# Patient Record
Sex: Female | Born: 1950
Health system: Southern US, Community
[De-identification: ages and names within clinical notes are randomized; demographics above are authoritative.]

## PROBLEM LIST (undated history)

## (undated) DIAGNOSIS — R51 Headache: Secondary | ICD-10-CM

## (undated) DIAGNOSIS — I10 Essential (primary) hypertension: Secondary | ICD-10-CM

## (undated) DIAGNOSIS — K219 Gastro-esophageal reflux disease without esophagitis: Secondary | ICD-10-CM

## (undated) DIAGNOSIS — E785 Hyperlipidemia, unspecified: Secondary | ICD-10-CM

## (undated) DIAGNOSIS — L409 Psoriasis, unspecified: Secondary | ICD-10-CM

## (undated) DIAGNOSIS — J449 Chronic obstructive pulmonary disease, unspecified: Secondary | ICD-10-CM

## (undated) DIAGNOSIS — J45909 Unspecified asthma, uncomplicated: Secondary | ICD-10-CM

## (undated) DIAGNOSIS — J310 Chronic rhinitis: Secondary | ICD-10-CM

## (undated) HISTORY — DX: Essential (primary) hypertension: I10

## (undated) HISTORY — DX: Headache: R51

## (undated) HISTORY — PX: COLONOSCOPY: SHX174

## (undated) HISTORY — DX: Psoriasis, unspecified: L40.9

## (undated) HISTORY — DX: Unspecified asthma, uncomplicated: J45.909

## (undated) HISTORY — DX: Chronic rhinitis: J31.0

---

## 1999-03-25 HISTORY — PX: NASAL SINUS SURGERY: SHX719

## 1999-07-17 ENCOUNTER — Encounter (INDEPENDENT_AMBULATORY_CARE_PROVIDER_SITE_OTHER): Payer: Self-pay | Admitting: Specialist

## 1999-07-17 ENCOUNTER — Other Ambulatory Visit: Admission: RE | Admit: 1999-07-17 | Discharge: 1999-07-17 | Payer: Self-pay | Admitting: *Deleted

## 1999-11-28 ENCOUNTER — Other Ambulatory Visit: Admission: RE | Admit: 1999-11-28 | Discharge: 1999-11-28 | Payer: Self-pay | Admitting: *Deleted

## 2006-07-13 ENCOUNTER — Ambulatory Visit (HOSPITAL_COMMUNITY): Admission: RE | Admit: 2006-07-13 | Discharge: 2006-07-13 | Payer: Self-pay | Admitting: Chiropractic Medicine

## 2006-08-20 ENCOUNTER — Encounter: Admission: RE | Admit: 2006-08-20 | Discharge: 2006-08-20 | Payer: Self-pay | Admitting: Obstetrics and Gynecology

## 2006-08-25 ENCOUNTER — Encounter: Admission: RE | Admit: 2006-08-25 | Discharge: 2006-08-25 | Payer: Self-pay | Admitting: Obstetrics and Gynecology

## 2007-08-23 ENCOUNTER — Encounter: Admission: RE | Admit: 2007-08-23 | Discharge: 2007-08-23 | Payer: Self-pay | Admitting: Counselor

## 2007-10-01 ENCOUNTER — Encounter: Admission: RE | Admit: 2007-10-01 | Discharge: 2007-10-01 | Payer: Self-pay | Admitting: Obstetrics and Gynecology

## 2007-10-11 ENCOUNTER — Encounter: Admission: RE | Admit: 2007-10-11 | Discharge: 2007-10-11 | Payer: Self-pay | Admitting: Obstetrics and Gynecology

## 2008-09-08 ENCOUNTER — Encounter: Payer: Self-pay | Admitting: Pulmonary Disease

## 2008-09-29 ENCOUNTER — Ambulatory Visit: Payer: Self-pay | Admitting: Pulmonary Disease

## 2008-09-29 DIAGNOSIS — R0602 Shortness of breath: Secondary | ICD-10-CM | POA: Insufficient documentation

## 2008-09-29 DIAGNOSIS — J4520 Mild intermittent asthma, uncomplicated: Secondary | ICD-10-CM | POA: Insufficient documentation

## 2008-09-29 DIAGNOSIS — J310 Chronic rhinitis: Secondary | ICD-10-CM | POA: Insufficient documentation

## 2008-09-29 DIAGNOSIS — J4540 Moderate persistent asthma, uncomplicated: Secondary | ICD-10-CM | POA: Insufficient documentation

## 2008-11-16 ENCOUNTER — Ambulatory Visit: Payer: Self-pay | Admitting: Pulmonary Disease

## 2008-11-16 LAB — CONVERTED CEMR LAB
ALT: 13 units/L (ref 0–35)
AST: 19 units/L (ref 0–37)
Albumin: 4.1 g/dL (ref 3.5–5.2)
Alkaline Phosphatase: 76 units/L (ref 39–117)
BUN: 15 mg/dL (ref 6–23)
Basophils Absolute: 0.1 10*3/uL (ref 0.0–0.1)
Basophils Relative: 0.7 % (ref 0.0–3.0)
Bilirubin, Direct: 0.1 mg/dL (ref 0.0–0.3)
CO2: 29 meq/L (ref 19–32)
Calcium: 9.2 mg/dL (ref 8.4–10.5)
Chloride: 107 meq/L (ref 96–112)
Creatinine, Ser: 0.8 mg/dL (ref 0.4–1.2)
Eosinophils Absolute: 0.1 10*3/uL (ref 0.0–0.7)
Eosinophils Relative: 1 % (ref 0.0–5.0)
GFR calc non Af Amer: 78.73 mL/min (ref >60)
Glucose, Bld: 90 mg/dL (ref 70–99)
HCT: 42.7 % (ref 36.0–46.0)
Hemoglobin: 14.3 g/dL (ref 12.0–15.0)
Lymphocytes Relative: 32.9 % (ref 12.0–46.0)
Lymphs Abs: 2.4 10*3/uL (ref 0.7–4.0)
MCHC: 33.5 g/dL (ref 30.0–36.0)
MCV: 90.7 fL (ref 78.0–100.0)
Monocytes Absolute: 0.6 10*3/uL (ref 0.1–1.0)
Monocytes Relative: 8.8 % (ref 3.0–12.0)
Neutro Abs: 4.1 10*3/uL (ref 1.4–7.7)
Neutrophils Relative %: 56.6 % (ref 43.0–77.0)
Platelets: 254 10*3/uL (ref 150.0–400.0)
Potassium: 4.1 meq/L (ref 3.5–5.1)
RBC: 4.71 M/uL (ref 3.87–5.11)
RDW: 13 % (ref 11.5–14.6)
Sodium: 142 meq/L (ref 135–145)
Total Bilirubin: 0.8 mg/dL (ref 0.3–1.2)
Total Protein: 7.2 g/dL (ref 6.0–8.3)
WBC: 7.3 10*3/uL (ref 4.5–10.5)

## 2008-11-22 LAB — CONVERTED CEMR LAB: IgE (Immunoglobulin E), Serum: 45 intl units/mL (ref 0.0–180.0)

## 2008-12-15 ENCOUNTER — Ambulatory Visit: Payer: Self-pay | Admitting: Pulmonary Disease

## 2008-12-15 DIAGNOSIS — R490 Dysphonia: Secondary | ICD-10-CM | POA: Insufficient documentation

## 2009-01-05 ENCOUNTER — Encounter: Admission: RE | Admit: 2009-01-05 | Discharge: 2009-01-05 | Payer: Self-pay | Admitting: Obstetrics and Gynecology

## 2010-01-24 ENCOUNTER — Encounter: Admission: RE | Admit: 2010-01-24 | Discharge: 2010-01-24 | Payer: Self-pay | Admitting: Family Medicine

## 2010-04-23 NOTE — Assessment & Plan Note (Signed)
Summary: f/u 4 wks ///kp   Primary Provider/Referring Provider:  [facility], [facility]  CC:  4 week rhinitis and dyspnea follow-up.   Pt still c/o a productive cough and nasal draingae that is clear.   She denies any sob and wheezing and fever.Marland Kitchen  History of Present Illness: I met Ms. Yogi today to follow up her dyspnea, asthma and chronic rhinitis.  She continues to have sinus drainage.  This has improved some since her last visit.  She has a cough mostly in the morning, and is productive of clear sputum.  She gets a raspy voice.  She denies hemoptysis, epistaxis, and fever.  She has some chest congestion, but feels her breathing is okay.  She denies wheeze, sneezing, rashes, and eye irritation.    She has been using OTC anti-histamines without much improvement.  She thinks she had imaging of sinus within the year, and was told she had chronic sinus infection.  Chest xray from September 08, 2008 showed chronic bronchitic changes.  Current Medications (verified): 1)  Symbicort 160-4.5 Mcg/act  Aero (Budesonide-Formoterol Fumarate) .... Two Puffs Twice Daily 2)  Singulair 10 Mg Tabs (Montelukast Sodium) .... One By Mouth At Bedtime 3)  Proair Hfa 108 (90 Base) Mcg/act  Aers (Albuterol Sulfate) .Marland Kitchen.. 1-2 Puffs Every 4-6 Hours As Needed 4)  Nasonex 50 Mcg/act Susp (Mometasone Furoate) .... Two Sprays Once Daily 5)  Loratadine 10 Mg Tabs (Loratadine) .... One By Mouth Once Daily As Needed 6)  Ibuprofen 200 Mg Tabs (Ibuprofen)  Allergies (verified): 1)  ! Penicillin  Past History:  Past Medical History: Last updated: 09/29/2008 Asthma Rhinitis Headache Psoriasis  Past Surgical History: Last updated: 09/29/2008 Sinus surgery in 2001  Family History: Last updated: 09/29/2008 Allergies---2 brothers and 1 sister Family History Hypertension---nother and father Family History MI/Heart Attack---mother and father CHF---father Breast cancer---sister Colon cancer---mother   Social History: Last updated: 09/29/2008 Former smoker, 2 packs per day for 20 years.  Quit 1995. Widowed, lives alone.  Family History: Reviewed history from 09/29/2008 and no changes required. Allergies---2 brothers and 1 sister Family History Hypertension---nother and father Family History MI/Heart Attack---mother and father CHF---father Breast cancer---sister Colon cancer---mother  Social History: Reviewed history from 09/29/2008 and no changes required. Former smoker, 2 packs per day for 20 years.  Quit 1995. Widowed, lives alone.  Vital Signs:  Patient profile:   61 year old female Height:      64 inches (162.56 cm) Weight:      217.13 pounds (98.70 kg) BMI:     37.40 O2 Sat:      94 % on Room air Temp:     97.8 degrees F (36.56 degrees C) oral Pulse rate:   72 / minute BP sitting:   124 / 80  (left arm) Cuff size:   large  Vitals Entered By: Michel Bickers CMA (November 16, 2008 12:15 PM)  O2 Flow:  Room air  Physical Exam  General:  normal appearance, healthy appearing, and obese.   Nose:  clear drainage, no tenderness Mouth:  MP 2, 2+ tonsills, no exudate Neck:  no masses, thyromegaly Lungs:  clear bilaterally to auscultation and percussion Heart:  regular rate and rhythm, S1, S2 without murmurs, rubs, gallops, or clicks Abdomen:  bowel sounds positive; abdomen soft and non-tender without masses, or organomegaly Extremities:  no clubbing, cyanosis, edema, or deformity noted Cervical Nodes:  no significant adenopathy   Impression & Recommendations:  Problem # 1:  ASTHMA (ICD-493.90)  Her asthma symptoms have improved with her current regimen.  Problem # 2:  CHRONIC RHINITIS (ICD-472.0) I think most of her current symptoms are related to her sinus disease.  I have asked to track down her sinus imaging studies, and forward a copy for my review.  I will continue her on nasal irrigation, nasonex, singulair, and as needed diphenydramine.  I will also check a  RAST and IgE.  Medications Added to Medication List This Visit: 1)  Diphenhydramine Hcl 50 Mg/ml Soln (Diphenhydramine hcl) .... One by mouth at bedtime as needed  Complete Medication List: 1)  Symbicort 160-4.5 Mcg/act Aero (Budesonide-formoterol fumarate) .... Two puffs twice daily 2)  Singulair 10 Mg Tabs (Montelukast sodium) .... One by mouth at bedtime 3)  Proair Hfa 108 (90 Base) Mcg/act Aers (Albuterol sulfate) .Marland Kitchen.. 1-2 puffs every 4-6 hours as needed 4)  Nasonex 50 Mcg/act Susp (Mometasone furoate) .... Two sprays once daily 5)  Loratadine 10 Mg Tabs (Loratadine) .... One by mouth once daily as needed 6)  Ibuprofen 200 Mg Tabs (Ibuprofen) 7)  Diphenhydramine Hcl 50 Mg/ml Soln (Diphenhydramine hcl) .... One by mouth at bedtime as needed  Other Orders: Est. Patient Level III (32951) T-"RAST" (Allergy Full Profile) IGE (88416-60630) TLB-BMP (Basic Metabolic Panel-BMET) (80048-METABOL) TLB-CBC Platelet - w/Differential (85025-CBCD) TLB-Hepatic/Liver Function Pnl (80076-HEPATIC)  Patient Instructions: 1)  Blood test today 2)  Try using diphenhydramine (benadryl) 50 mg at bedtime for one week, then as needed after that 3)  Follow up in 4 to 6 weeks

## 2010-04-23 NOTE — Assessment & Plan Note (Signed)
Summary: copd/asthma/apc   Primary Provider/Referring Provider:  [facility]  CC:  Pulmonary consult for sob.   Pt referred by [facility].   She did bring a CD of the CXR.Marland Kitchen  History of Present Illness: I met Ms. Metter today to evaluate her dyspnea.  She developed a cough and fever in June.  She had an acute asthma attack, and was treated with prednisone and a breathing treatment.  She did not use antibiotics.  She was told about 10 years ago that she has asthma.  This was associated with chronic sinus congestion.  She had sinus surgery 10 years ago, and this improved her symptoms some.    She used to see Dr. Sidney Ace, and was told she had allergies to dust mite and mold.  She currently has a cough with white sputum.  She denies hemoptysis.  She does get some wheeze, and tightness in her chest.  She thinks some of her wheeze comes from her chest and some from her throat.  She has been getting hoarse as a result of her cough.  She has been getting more sinus congestion with post-nasal drip.  She has also been getting a globus sensation.  She has occasional reflux and uses as needed prilosec.  She gets hives when she takes penicillin.  She does not have any reaction to using aspirin.  She quit smoking 15 years ago, and smoked 2 to 3 packs per day for 20 years.  She moved to [place] from [place] 5 years ago.  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  She was using astepro and singulair, but stop these because she wasn't sure if they were helping.  She was started on symbicort, and this helped.  As a result she has not needed to use her proair for the past two weeks.  She has been using her netty pott, but only as needed rather than on a regular basis.  She has gained about 70 lbs over the past few years.  Chest xray from September 08, 2008 showed chronic bronchitic changes.   Spirometry today was normal.  Preventive Screening-Counseling & Management  Alcohol-Tobacco     Smoking Status: quit     Year Quit: 1995     Pack years: 20 yrs x2 PPD  Allergies: 1)  ! Penicillin  Past History:  Past Medical History: Asthma Rhinitis Headache Psoriasis  Past Surgical History: Sinus surgery in 2001  Family History: Allergies---2 brothers and 1 sister Family History Hypertension---nother and father Family History MI/Heart Attack---mother and father CHF---father Breast cancer---sister Colon cancer---mother  Social History: Former smoker, 2 packs per day for 20 years.  Quit 1995. Widowed, lives alone. Smoking Status:  quit Pack years:  20 yrs x2 PPD  Vital Signs:  Patient profile:   60 year old female Height:      64 inches (162.56 cm) Weight:      227 pounds (103.18 kg) BMI:     39.11 O2 Sat:      96 % on Room air Temp:     97.9 degrees F (36.61 degrees C) oral Pulse rate:   70 / minute BP sitting:   124 / 80  (left arm) Cuff size:   large  Vitals Entered By: Michel Bickers CMA (September 29, 2008 3:16 PM)  O2 Flow:  Room air CC: Pulmonary consult  for sob.   Pt referred by [facility].   She did bring a CD of the CXR.   Physical Exam  General:  normal appearance, healthy appearing, and obese.   Eyes:  PERRLA and EOMI.   Ears:  mild cerumen build up Nose:  clear drainage, no tenderness Mouth:  MP 2, 2+ tonsills, no exudate Neck:  no masses, thyromegaly Chest Wall:  no deformities noted Lungs:  coarse breath sounds, no wheezing or rales Heart:  regular rate and rhythm, S1, S2 without murmurs, rubs, gallops, or clicks Abdomen:  bowel sounds positive; abdomen soft and non-tender without masses, or organomegaly Pulses:  pulses normal Extremities:  no clubbing, cyanosis, edema, or deformity noted Neurologic:  CN II-XII grossly intact with normal reflexes, coordination, muscle strength and tone Cervical Nodes:  no significant adenopathy    Pre-Spirometry FVC    Value: 3.52 L/min   Pred: 3.17 L/min     % Pred: 111.00 % FEV1    Value: 2.55 L     Pred: 2.55 L     % Pred: 100 % FEV1/FVC  Value: 0.72 %     Pred: 0.80 %    FEF 25-75  Value: 1.68 L/min   Pred: 2.60 L/min     Impression & Recommendations:  Problem # 1:  DYSPNEA (ICD-786.05) She has a history of asthma and rhinitis.  She has a previous history of tobacco abuse, but her spirometry today did not show airflow obstruction.  I think her sinus disease is perpetuating her asthma symptoms which is the most likely cause of her dyspnea.  She may also have a component of deconditioning related to her weight gain and inactivity.  Problem # 2:  ASTHMA (ICD-493.90) She is to continue symbicort and restart singulair.  Hopefully once her sinus problems have improved her asthma, and dyspnea will improve as well.  Problem # 3:  CHRONIC RHINITIS (ICD-472.0) She is to continue her sinus regimen with nasal irrigation.  She is nasonex once daily, and restart singulair at bedtime.  She is to also use loratadine as needed.  Medications Added to Medication List This Visit: 1)  Singulair 10 Mg Tabs (Montelukast sodium) .... One by mouth at bedtime 2)  Nasonex 50 Mcg/act Susp (Mometasone furoate) .... Two sprays once daily 3)  Loratadine 10 Mg Tabs (Loratadine) .... One by mouth once daily as needed  Complete Medication List: 1)  Symbicort 160-4.5 Mcg/act Aero (Budesonide-formoterol fumarate) .... Two puffs twice daily 2)  Singulair 10 Mg Tabs (Montelukast sodium) .... One by mouth at bedtime 3)  Proair Hfa 108 (90 Base) Mcg/act Aers (Albuterol sulfate) .Marland Kitchen.. 1-2 puffs every 4-6 hours as needed 4)  Nasonex 50 Mcg/act Susp (Mometasone furoate) .... Two sprays once daily 5)  Loratadine 10 Mg Tabs (Loratadine) .... One by mouth once daily as needed 6)  Ibuprofen 200 Mg Tabs (Ibuprofen)  Other Orders: Consultation Level IV (16109) Spirometry w/Graph (94010)  Patient Instructions: 1)   Continue symbicort and proair 2)  Restart singulair 10 mg at bedtime 3)  Netty Pott once daily before nasonex 4)  Nasonex two sprays once daily  5)  Loratadine 10 mg once daily for one week, then as needed  6)  Follow up in 4 weeks Prescriptions: SINGULAIR 10 MG TABS (MONTELUKAST SODIUM) one by mouth at bedtime  #30 x 3   Entered and Authorized by:   Coralyn Helling MD   Signed by:   Coralyn Helling MD on 09/29/2008   Method  used:   Print then Give to Patient   RxID:   [id number] NASONEX 50 MCG/ACT SUSP (MOMETASONE FUROATE) two sprays once daily  #1 x 3   Entered and Authorized by:   Coralyn Helling MD   Signed by:   Coralyn Helling MD on 09/29/2008   Method used:   Print then Give to Patient   RxID:   [id number]   SPIROMETRY RESULTS  Date:09/29/2008 height inches: 64 Gender: Female  Parameter  Measured Predicted %Predicted  FVC      3.52        3.17      111.00  FEV1      2.55        2.55      100  FEV1/FVC      0.72        0.80      90  FEF25-75      1.68        2.60      65  PEF              395      0  Post-Bronchodilator  Parameter Measured Change from Baseline  FVC               FEV1               FEV1/FVC                FEF25-75                PEF               Pulse Oximetry Pulse: 70 O2 Saturation %: 96  Comments: Normal spirometry.

## 2010-12-17 ENCOUNTER — Other Ambulatory Visit: Payer: Self-pay | Admitting: Obstetrics and Gynecology

## 2010-12-17 DIAGNOSIS — Z1231 Encounter for screening mammogram for malignant neoplasm of breast: Secondary | ICD-10-CM

## 2010-12-19 ENCOUNTER — Ambulatory Visit
Admission: RE | Admit: 2010-12-19 | Discharge: 2010-12-19 | Disposition: A | Discharge status: Home / Self Care | Payer: 59 | Source: Ambulatory Visit | Attending: Obstetrics and Gynecology | Admitting: Obstetrics and Gynecology

## 2010-12-19 DIAGNOSIS — Z1231 Encounter for screening mammogram for malignant neoplasm of breast: Secondary | ICD-10-CM

## 2011-12-11 ENCOUNTER — Ambulatory Visit: Payer: 59 | Admitting: Pulmonary Disease

## 2011-12-24 ENCOUNTER — Institutional Professional Consult (permissible substitution): Payer: 59 | Admitting: Pulmonary Disease

## 2012-01-23 ENCOUNTER — Ambulatory Visit (INDEPENDENT_AMBULATORY_CARE_PROVIDER_SITE_OTHER): Payer: 59 | Admitting: Pulmonary Disease

## 2012-01-23 ENCOUNTER — Encounter: Payer: Self-pay | Admitting: *Deleted

## 2012-01-23 ENCOUNTER — Ambulatory Visit (INDEPENDENT_AMBULATORY_CARE_PROVIDER_SITE_OTHER)
Admission: RE | Admit: 2012-01-23 | Discharge: 2012-01-23 | Disposition: A | Discharge status: Home / Self Care | Payer: 59 | Source: Ambulatory Visit | Attending: Pulmonary Disease | Admitting: Pulmonary Disease

## 2012-01-23 VITALS — BP 128/84 | HR 83 | Temp 98.5°F | Ht 64.0 in | Wt 215.8 lb

## 2012-01-23 DIAGNOSIS — J45909 Unspecified asthma, uncomplicated: Secondary | ICD-10-CM

## 2012-01-23 DIAGNOSIS — R053 Chronic cough: Secondary | ICD-10-CM | POA: Insufficient documentation

## 2012-01-23 DIAGNOSIS — J310 Chronic rhinitis: Secondary | ICD-10-CM

## 2012-01-23 DIAGNOSIS — R059 Cough, unspecified: Secondary | ICD-10-CM

## 2012-01-23 DIAGNOSIS — R05 Cough: Secondary | ICD-10-CM

## 2012-01-23 MED ORDER — CETIRIZINE HCL 10 MG PO TABS: ORAL_TABLET | ORAL | Status: DC

## 2012-01-23 MED ORDER — DIPHENHYDRAMINE HCL 50 MG PO CAPS: ORAL_CAPSULE | ORAL | Status: DC

## 2012-01-23 MED ORDER — ALBUTEROL SULFATE HFA 108 (90 BASE) MCG/ACT IN AERS: 2.0000 | INHALATION_SPRAY | Freq: Four times a day (QID) | RESPIRATORY_TRACT | Status: DC | PRN

## 2012-01-23 MED ORDER — MOMETASONE FUROATE 50 MCG/ACT NA SUSP: 2.0000 | Freq: Every day | NASAL | Status: DC

## 2012-01-23 NOTE — Patient Instructions (Signed)
Nasal irrigation (saline nasal spray) daily until next visit Nasonex two sprays in each nostril daily after nasal irrigation until next visit Cetirizine 10 mg daily for two weeks, then as needed Diphenhydramine 50 mg nightly for two weeks, then as needed Proair two puffs as needed for cough, wheeze, or chest congestion Chest xray today Will schedule breathing test (PFT) Follow up in 4 weeks.

## 2012-01-23 NOTE — Progress Notes (Signed)
Primary Care Physician:   Referring provider:    Chief Complaint  Patient presents with  . Advice Only    last seen 11/2008. c/o constant cough w/ white phlem, blows out clear phlem. nasal congestion, PND. thinks her problems are related to certain foods    History of Present Illness: Michele Reyes is a 61 y.o. female former smoker for evaluation of chronic cough.  I last saw her in September 2010.  Concern at that time was related to asthma, and rhinitis with post-nasal drip.  She was seen by Dr. Lazarus Salines in 2011 for sinus infection.  She was treated with flonase and astepro.  She is not using these now.  She was recommend to use nasal irrigation, but is not using this either.  She felt benadryl helped her sleep and sinus congestion, but was advised by ENT not to use this.  She was on singulair, but this didn't help.  She had allergy testing previously by Dr. Amery Callas, but reports that allergy tests were negative.  She gets a cough with white sputum in the mornings.  She has sinus congestion and post-nasal drip.  She gets occasional wheeze when she lays in a bad position in bed.  She gets a globus sensation, but denies dysphagia/odynophagia.  She denies ear pain or ringing.  Strong smells can bring on her symptoms.   She tried adjusting her diet to determine if she had food intolerance causing her symptoms.  She lost weight with diet change, and felt better.  Unfortunately, she could not stick with her diet and regained weight.  She also feels that stress at work contributes to her symptoms.  She gets occasional heartburn, and uses prilosec as needed.  She gets a rash from psoriasis, but not otherwise.  She quit smoking years ago.  There is no history of pneumonia or exposure to TB.  Tests: Spirometry 09/29/08 >> FEV1 2.55(100%), FEV1% 72 11/16/08 IgE 45 CT sinus 01/24/10>>b/l maxillary sinusitis  Past Medical History  Diagnosis Date  . Asthma   . Rhinitis   . Headache   . Psoriasis    . Hypertension     Past Surgical History  Procedure Date  . Nasal sinus surgery 2001    Current Outpatient Prescriptions on File Prior to Visit  Medication Sig Dispense Refill  . albuterol (PROVENTIL HFA;VENTOLIN HFA) 108 (90 BASE) MCG/ACT inhaler Inhale 2 puffs into the lungs every 6 (six) hours as needed.      Marland Kitchen amLODipine-olmesartan (AZOR) 5-40 MG per tablet Take 1 tablet by mouth daily.        Allergies  Allergen Reactions  . Levaquin (Levofloxacin) Hives  . Penicillins     REACTION: hives    Family History  Problem Relation Age of Onset  . Allergies Brother     x2  . Allergies Sister   . Hypertension Mother   . Hypertension Father   . Heart attack Mother   . Heart attack Father   . Heart failure Father   . Breast cancer Sister   . Colon cancer Mother     History  Substance Use Topics  . Smoking status: Former Smoker -- 2.0 packs/day for 10 years    Types: Cigarettes    Quit date: 03/24/1993  . Smokeless tobacco: Not on file  . Alcohol Use: Yes     occasionally    Review of Systems  Constitutional: Positive for unexpected weight change. Negative for fever and appetite change.  HENT: Positive for congestion  and postnasal drip. Negative for ear pain, sore throat, sneezing, trouble swallowing and dental problem.   Respiratory: Positive for cough and shortness of breath.   Cardiovascular: Negative for chest pain, palpitations and leg swelling.  Gastrointestinal: Negative for abdominal pain.  Musculoskeletal: Negative for joint swelling.  Skin: Negative for rash.  Neurological: Negative for headaches.  Psychiatric/Behavioral: Positive for hallucinations. The patient is nervous/anxious.     Physical Exam: Filed Vitals:   01/23/12 0925  BP: 128/84  Pulse: 83  Temp: 98.5 F (36.9 C)  TempSrc: Oral  Height: 5\' 4"  (1.626 m)  Weight: 215 lb 12.8 oz (97.886 kg)  SpO2: 96%    Current Encounter SPO2  01/23/12 0925 96%  12/15/08 1419 95%  11/16/08 1200  94%    Wt Readings from Last 3 Encounters:  01/23/12 215 lb 12.8 oz (97.886 kg)  12/15/08 215 lb (97.523 kg)  11/16/08 217 lb 2.1 oz (98.49 kg)    Body mass index is 37.04 kg/(m^2).   General - No distress ENT - No sinus tenderness, no oral exudate, no LAN, no thyromegaly, TM clear, pupils equal/reactive Cardiac - s1s2 regular, no murmur, pulses symmetric Chest - No wheeze/rales/dullness, good air entry, normal respiratory excursion Back - No focal tenderness Abd - Soft, non-tender, no organomegaly, + bowel sounds Ext - No edema Neuro - Normal strength, cranial nerves intact Skin - No rashes Psych - Normal mood, and behavior.   Lab Results  Component Value Date   WBC 7.3 11/16/2008   HGB 14.3 11/16/2008   HCT 42.7 11/16/2008   MCV 90.7 11/16/2008   PLT 254.0 11/16/2008    Lab Results  Component Value Date   CREATININE 0.8 11/16/2008   BUN 15 11/16/2008   NA 142 11/16/2008   K 4.1 11/16/2008   CL 107 11/16/2008   CO2 29 11/16/2008    Lab Results  Component Value Date   ALT 13 11/16/2008   AST 19 11/16/2008   ALKPHOS 76 11/16/2008   BILITOT 0.8 11/16/2008    Assessment/Plan:  Coralyn Helling, MD [facility] Pager:  [phone] 01/23/2012, 9:28 AM

## 2012-01-23 NOTE — Assessment & Plan Note (Signed)
I think the majority of her symptoms are related to rhinitis and post-nasal drip.  Will have her use nasal irrigation and nasonex on a regular basis until next visit.  She can use cetirizine and diphenhydramine for two weeks, and then as needed.  Depending on her response will determine whether she needs additional sinus imaging studies or allergy testing.  Of note is that she was tried on flonase previously for several months from ENT, but this was not effective for her.

## 2012-01-23 NOTE — Assessment & Plan Note (Signed)
She can continue proair as needed.  Will further assess with pulmonary function testing.

## 2012-01-23 NOTE — Assessment & Plan Note (Signed)
Likely related to rhinitis, post-nasal drip, and asthma.  She has reflux also, but not a prominent symptom at present.

## 2012-01-23 NOTE — Progress Notes (Deleted)
  Subjective:    Patient ID: Michele Reyes, female    DOB: 1951/03/14, 61 y.o.   MRN: [id number]  HPI    Review of Systems  Constitutional: Positive for unexpected weight change. Negative for fever and appetite change.  HENT: Positive for congestion and postnasal drip. Negative for ear pain, sore throat, sneezing, trouble swallowing and dental problem.   Respiratory: Positive for cough and shortness of breath.   Cardiovascular: Negative for chest pain, palpitations and leg swelling.  Gastrointestinal: Negative for abdominal pain.  Musculoskeletal: Negative for joint swelling.  Skin: Negative for rash.  Neurological: Negative for headaches.  Psychiatric/Behavioral: Positive for hallucinations. The patient is nervous/anxious.        Objective:   Physical Exam        Assessment & Plan:

## 2012-01-27 ENCOUNTER — Telehealth: Payer: Self-pay | Admitting: Pulmonary Disease

## 2012-01-27 NOTE — Telephone Encounter (Signed)
Dg Chest 2 View  01/23/2012  *RADIOLOGY REPORT*  Clinical Data: Chronic coughing.  History of smoking in the past.   CHEST - 2 VIEW  Comparison: None.   Findings: The cardiac silhouette is normal size and shape. Ectasia and nonaneurysmal calcification of the thoracic aorta are seen. There is flattening of the diaphragm on lateral image with generalized hyperinflation configuration consistent with COPD.  No pulmonary infiltrates or masses were evident.  There is some central peribronchial thickening. No pleural abnormality is evident.  Changes of degenerative disc disease and degenerative spondylosis are present.   IMPRESSION: Generalized hyperinflation configuration with flattening of diaphragm consistent with element of COPD.  No peripheral infiltrate or consolidation is evident.  Minimal central peribronchial thickening is present.  This may be associated with bronchitis, asthma, and reactive airway disease.    Original Report Authenticated By: Onalee Hua Call    Results d/w pt.  Advised her to continue prn albuterol, continue sinus regimen, and will call back once PFT's reviewed.

## 2012-02-25 ENCOUNTER — Encounter: Payer: Self-pay | Admitting: Pulmonary Disease

## 2012-02-25 ENCOUNTER — Ambulatory Visit (INDEPENDENT_AMBULATORY_CARE_PROVIDER_SITE_OTHER): Payer: 59 | Admitting: Pulmonary Disease

## 2012-02-25 VITALS — BP 110/62 | HR 70 | Temp 97.9°F | Ht 64.0 in | Wt 218.0 lb

## 2012-02-25 DIAGNOSIS — R059 Cough, unspecified: Secondary | ICD-10-CM

## 2012-02-25 DIAGNOSIS — R053 Chronic cough: Secondary | ICD-10-CM

## 2012-02-25 DIAGNOSIS — J310 Chronic rhinitis: Secondary | ICD-10-CM

## 2012-02-25 DIAGNOSIS — J45909 Unspecified asthma, uncomplicated: Secondary | ICD-10-CM

## 2012-02-25 DIAGNOSIS — J4520 Mild intermittent asthma, uncomplicated: Secondary | ICD-10-CM

## 2012-02-25 DIAGNOSIS — R05 Cough: Secondary | ICD-10-CM

## 2012-02-25 LAB — PULMONARY FUNCTION TEST

## 2012-02-25 NOTE — Progress Notes (Signed)
PFT done today. 

## 2012-02-25 NOTE — Assessment & Plan Note (Signed)
This was improved with sinus regimen.  She likely has viral sinus infection at present.  I don't think she needs antibiotics at present.  Advised her to continue her current sinus regimen.  She is to call back her her sinus symptoms get worse.

## 2012-02-25 NOTE — Patient Instructions (Signed)
Call if sinus symptoms get worse Follow up in 4 months

## 2012-02-25 NOTE — Assessment & Plan Note (Signed)
She has FEV1/FVC ratio < 70%, but her actual FEV1 and FVC were normal.  She has minimal lower respiratory symptoms.  She can continue prn albuterol for now.

## 2012-02-25 NOTE — Assessment & Plan Note (Signed)
Likely related to rhinitis, post-nasal drip, and asthma.

## 2012-02-25 NOTE — Progress Notes (Signed)
  Chief Complaint  Patient presents with  . Follow-up    w/ PFT. c/o sinus infection- nasal congestion, PND, blowing out yellow-green-grey phlem, facial pressure, runny nose x few days. has cough w/ clear-white phlem, occasional chest tx. no recent wheezing or SOB.     History of Present Illness: Michele Reyes is a 61 y.o. female former smoker with chronic cough from asthma and post-nasal drip.  Her cough was doing better with sinus regimen up to the past 2 days.  She thinks she has a sinus infection.  She has sinus congestion with yellow drainage.  She has been getting some nose bleeds.  She has pressure feeling over her forehead.  She denies pain, headaches, ear pain, fever, or sore throat.  She has occasional cough, but no worse than usual.  She denies wheeze, chest pain, or chest tightness.  She had PFT earlier today.  This showed borderline obstruction, normal lung volumes, mild diffusion defect that corrects for lung volumes, and no bronchodilator response.  Tests: Spirometry 09/29/08 >> FEV1 2.55(100%), FEV1% 72 11/16/08 IgE 45 CT sinus 01/24/10>>b/l maxillary sinusitis CXR 01/23/12>>hyperinflation, peribronchial thickening PFT 02/25/12>>FEV1 2.41 (105%), FEV1% 63, TLC 5.28 (108%), DLCO 75%, no BD  Past Medical History  Diagnosis Date  . Asthma   . Rhinitis   . Headache   . Psoriasis   . Hypertension     Past Surgical History  Procedure Date  . Nasal sinus surgery 2001    Current Outpatient Prescriptions on File Prior to Visit  Medication Sig Dispense Refill  . albuterol (PROAIR HFA) 108 (90 BASE) MCG/ACT inhaler Inhale 2 puffs into the lungs every 6 (six) hours as needed for wheezing.  1 Inhaler  3  . Azilsartan-Chlorthalidone (EDARBYCLOR) 40-12.5 MG TABS Take 1 tablet by mouth daily.      . mometasone (NASONEX) 50 MCG/ACT nasal spray Place 2 sprays into the nose daily.  17 g  11  . Saline 0.2 % SOLN Daily      . [DISCONTINUED] cetirizine (ZYRTEC) 10 MG tablet One  pill daily for two weeks, then as needed        Allergies  Allergen Reactions  . Levaquin (Levofloxacin) Hives  . Penicillins     REACTION: hives      Physical Exam: Filed Vitals:   02/25/12 1351 02/25/12 1404  BP:  110/62  Pulse:  70  Temp: 97.9 F (36.6 C)   TempSrc: Oral   Height: 5\' 4"  (1.626 m)   Weight: 218 lb (98.884 kg)   SpO2:  100%    Current Encounter SPO2  02/25/12 1404 100%  01/23/12 0925 96%  12/15/08 1419 95%    Wt Readings from Last 3 Encounters:  02/25/12 218 lb (98.884 kg)  01/23/12 215 lb 12.8 oz (97.886 kg)  12/15/08 215 lb (97.523 kg)    Body mass index is 37.42 kg/(m^2).   General - No distress ENT - No sinus tenderness, clear nasal discharge, no oral exudate, no LAN, no thyromegaly, cerumen build up b/l, TM clear Cardiac - s1s2 regular, no murmur Chest - No wheeze/rales/dullness, good air entry, normal respiratory excursion Back - No focal tenderness Abd - Soft, non-tender Ext - No edema Neuro - Normal strength Skin - No rashes Psych - Normal mood, and behavior   Assessment/Plan:  Coralyn Helling, MD [facility]Critical [facility]/Sleep Pager:  [phone] 02/25/2012, 2:17 PM

## 2012-07-07 ENCOUNTER — Telehealth: Payer: Self-pay | Admitting: Pulmonary Disease

## 2012-07-07 NOTE — Telephone Encounter (Signed)
Attempted to call pt x's 3 to make next ov per recall.  PT never returned calls.  Mailed recall letter 07/07/12. Emily E McAlister °

## 2012-07-14 ENCOUNTER — Other Ambulatory Visit: Payer: Self-pay

## 2012-07-14 DIAGNOSIS — Z1231 Encounter for screening mammogram for malignant neoplasm of breast: Secondary | ICD-10-CM

## 2012-08-10 ENCOUNTER — Ambulatory Visit (INDEPENDENT_AMBULATORY_CARE_PROVIDER_SITE_OTHER): Payer: 59 | Admitting: Pulmonary Disease

## 2012-08-10 ENCOUNTER — Encounter: Payer: Self-pay | Admitting: Pulmonary Disease

## 2012-08-10 VITALS — BP 124/78 | HR 76 | Temp 98.4°F | Ht 64.0 in | Wt 243.4 lb

## 2012-08-10 DIAGNOSIS — J45909 Unspecified asthma, uncomplicated: Secondary | ICD-10-CM

## 2012-08-10 DIAGNOSIS — J310 Chronic rhinitis: Secondary | ICD-10-CM

## 2012-08-10 DIAGNOSIS — R059 Cough, unspecified: Secondary | ICD-10-CM

## 2012-08-10 DIAGNOSIS — J4520 Mild intermittent asthma, uncomplicated: Secondary | ICD-10-CM

## 2012-08-10 DIAGNOSIS — R05 Cough: Secondary | ICD-10-CM

## 2012-08-10 DIAGNOSIS — R053 Chronic cough: Secondary | ICD-10-CM

## 2012-08-10 MED ORDER — MONTELUKAST SODIUM 10 MG PO TABS: 10.0000 mg | ORAL_TABLET | Freq: Every day | ORAL | Status: DC

## 2012-08-10 MED ORDER — BECLOMETHASONE DIPROPIONATE 80 MCG/ACT IN AERS: 2.0000 | INHALATION_SPRAY | Freq: Two times a day (BID) | RESPIRATORY_TRACT | Status: DC

## 2012-08-10 NOTE — Assessment & Plan Note (Signed)
Related to rhinitis, post-nasal drip, and asthma.

## 2012-08-10 NOTE — Assessment & Plan Note (Addendum)
She has worsening symptoms with onset of Spring.  Likely related to worsening allergies.  Will add Qvar and singulair.  She is to continue prn albuterol.  Will re-assess her status in 6 weeks.  I don't think she needs prednisone or chest xray at present.

## 2012-08-10 NOTE — Progress Notes (Signed)
  Chief Complaint  Patient presents with  . Follow-up    Pt states her cough is worse. She is bringing up mostly white phlem w/ occasional faint yellow color phlem. C/o wheezing, chest tx, nasal congestion, drainage, hoarseness, sinus HA.     History of Present Illness: Michele Reyes is a 62 y.o. female former smoker with chronic cough from asthma and post-nasal drip.  Since start of Spring she has noticed more trouble with her cough.  She is bringing up clear to yellow sputum.  She denies fever or hemoptysis.  She is getting episodes of chest tightness and wheeze, and has to use her albuterol at least several times per week now.  Her wheezing happens more at night.  She gets more winded with activities, and feels tired.  She is getting sinus congestion and frequent frontal headaches.  She is using nasonex, nasal irrigation, and zyrtec daily.  Tests: Spirometry 09/29/08 >> FEV1 2.55(100%), FEV1% 72 11/16/08 IgE 45 CT sinus 01/24/10>>b/l maxillary sinusitis CXR 01/23/12>>hyperinflation, peribronchial thickening PFT 02/25/12>>FEV1 2.41 (105%), FEV1% 63, TLC 5.28 (108%), DLCO 75%, no BD Spirometry 08/10/12 >> FEV1 1.95 (79%), FEV1% 69  She  has a past medical history of Asthma; Rhinitis; Headache; Psoriasis; and Hypertension.  She  has past surgical history that includes Nasal sinus surgery (2001).  Current Outpatient Prescriptions on File Prior to Visit  Medication Sig Dispense Refill  . albuterol (PROAIR HFA) 108 (90 BASE) MCG/ACT inhaler Inhale 2 puffs into the lungs every 6 (six) hours as needed for wheezing.  1 Inhaler  3  . Azilsartan-Chlorthalidone (EDARBYCLOR) 40-12.5 MG TABS Take 1 tablet by mouth daily.      . cetirizine (ZYRTEC) 10 MG tablet Take 10 mg by mouth daily.      . mometasone (NASONEX) 50 MCG/ACT nasal spray Place 2 sprays into the nose daily.  17 g  11   No current facility-administered medications on file prior to visit.    Allergies  Allergen Reactions  .  Levaquin (Levofloxacin) Hives  . Penicillins     REACTION: hives    Physical Exam:  General - No distress ENT - No sinus tenderness, clear to yellow nasal discharge, no oral exudate, no LAN, no thyromegaly, cerumen build up b/l, TM clear Cardiac - s1s2 regular, no murmur Chest - Decreased breath sounds, scattered rhonchi clear with cough, no wheeze/rales Back - No focal tenderness Abd - Soft, non-tender Ext - No edema Neuro - Normal strength Skin - No rashes Psych - Normal mood, and behavior    Assessment/Plan:  Coralyn Helling, MD [facility]Critical Care/Sleep Pager:  [phone] 08/10/2012, 2:15 PM

## 2012-08-10 NOTE — Patient Instructions (Signed)
Qvar two puffs twice per day, and rinse mouth after each use Montelukast (singulair) 10 mg pill >> take one pill nightly Follow up in 6 weeks

## 2012-08-10 NOTE — Assessment & Plan Note (Signed)
She is to continue nasal irrigation, nasonex, and zyrtec.  Addition of singulair should help this also.  I don't think she needs antibiotics at present.

## 2012-08-11 ENCOUNTER — Ambulatory Visit
Admission: RE | Admit: 2012-08-11 | Discharge: 2012-08-11 | Disposition: A | Discharge status: Home / Self Care | Payer: 59 | Source: Ambulatory Visit

## 2012-08-11 DIAGNOSIS — Z1231 Encounter for screening mammogram for malignant neoplasm of breast: Secondary | ICD-10-CM

## 2012-09-21 ENCOUNTER — Encounter: Payer: Self-pay | Admitting: Pulmonary Disease

## 2012-09-21 ENCOUNTER — Ambulatory Visit (INDEPENDENT_AMBULATORY_CARE_PROVIDER_SITE_OTHER): Payer: 59 | Admitting: Pulmonary Disease

## 2012-09-21 VITALS — BP 128/82 | HR 82 | Temp 99.6°F | Ht 64.0 in | Wt 244.6 lb

## 2012-09-21 DIAGNOSIS — J310 Chronic rhinitis: Secondary | ICD-10-CM

## 2012-09-21 DIAGNOSIS — J45909 Unspecified asthma, uncomplicated: Secondary | ICD-10-CM

## 2012-09-21 DIAGNOSIS — R059 Cough, unspecified: Secondary | ICD-10-CM

## 2012-09-21 DIAGNOSIS — J4520 Mild intermittent asthma, uncomplicated: Secondary | ICD-10-CM

## 2012-09-21 DIAGNOSIS — R05 Cough: Secondary | ICD-10-CM

## 2012-09-21 DIAGNOSIS — R053 Chronic cough: Secondary | ICD-10-CM

## 2012-09-21 NOTE — Patient Instructions (Signed)
Follow up in 4 months 

## 2012-09-21 NOTE — Assessment & Plan Note (Signed)
Related to rhinitis, post-nasal drip, and asthma.  Improved.

## 2012-09-21 NOTE — Assessment & Plan Note (Signed)
Improved with addition of Abx by ENT.

## 2012-09-21 NOTE — Assessment & Plan Note (Signed)
Improved with addition of Qvar.  She is to continue this for now.

## 2012-09-21 NOTE — Progress Notes (Signed)
  Chief Complaint  Patient presents with  . Cough    6 week follow up    History of Present Illness: Michele Reyes is a 62 y.o. female former smoker with chronic cough from asthma and post-nasal drip.  She was seen by ENT since last visit.  She was started on two Abx, and doing better with this.  She never started singulair.  She feels Qvar has helped.  She has not needed to use albuterol much. She still uses nasonex.  Tests: Spirometry 09/29/08 >> FEV1 2.55(100%), FEV1% 72 11/16/08 IgE 45 CT sinus 01/24/10>>b/l maxillary sinusitis CXR 01/23/12>>hyperinflation, peribronchial thickening PFT 02/25/12>>FEV1 2.41 (105%), FEV1% 63, TLC 5.28 (108%), DLCO 75%, no BD Spirometry 08/10/12 >> FEV1 1.95 (79%), FEV1% 69  She  has a past medical history of Asthma; Rhinitis; Headache(784.0); Psoriasis; and Hypertension.  She  has past surgical history that includes Nasal sinus surgery (2001).  Current Outpatient Prescriptions on File Prior to Visit  Medication Sig Dispense Refill  . albuterol (PROAIR HFA) 108 (90 BASE) MCG/ACT inhaler Inhale 2 puffs into the lungs every 6 (six) hours as needed for wheezing.  1 Inhaler  3  . Azilsartan-Chlorthalidone (EDARBYCLOR) 40-12.5 MG TABS Take 1 tablet by mouth daily.      . beclomethasone (QVAR) 80 MCG/ACT inhaler Inhale 2 puffs into the lungs 2 (two) times daily.  1 Inhaler  2  . cetirizine (ZYRTEC) 10 MG tablet Take 10 mg by mouth daily.      Marland Kitchen ibuprofen (ADVIL,MOTRIN) 200 MG tablet Take 200 mg by mouth every 6 (six) hours as needed for pain.      . mometasone (NASONEX) 50 MCG/ACT nasal spray Place 2 sprays into the nose daily.  17 g  11  . omeprazole (PRILOSEC) 20 MG capsule Take 20 mg by mouth. 4 times a week       No current facility-administered medications on file prior to visit.    Allergies  Allergen Reactions  . Levaquin (Levofloxacin) Hives  . Penicillins     REACTION: hives    Physical Exam:  General - No distress ENT - No sinus  tenderness, no oral exudate, no LAN Cardiac - s1s2 regular, no murmur Chest - no wheeze/rales Back - No focal tenderness Abd - Soft, non-tender Ext - No edema Neuro - Normal strength Skin - No rashes Psych - Normal mood, and behavior    Assessment/Plan:  Coralyn Helling, MD [facility]/Critical Care/Sleep Pager:  [phone] 09/21/2012, 3:14 PM

## 2012-11-30 ENCOUNTER — Telehealth: Payer: Self-pay | Admitting: Pulmonary Disease

## 2012-11-30 MED ORDER — AEROCHAMBER MINI CHAMBER DEVI: 1.0000 | Freq: Two times a day (BID) | Status: DC

## 2012-11-30 NOTE — Telephone Encounter (Signed)
Called, spoke with pt - Reports initially she didn't take qvar as instructed.  States she initially took it once daily.  States she did finally increase the qvar to bid.  Approx 1 wk after increasing it, she noticed a raspy voice.  She has since then taken herself off of the qvar.  Pt reports she does notice slight increased DOE, does still have PND, and cough seems to be more uncontrollable over the past couple of days.  No wheezing.  Reports she was rinsing mouth really well , was gargling, and brushing as well after each inhaler use.  She is requesting further recs from Dr. Craige Cotta and would like to know if a spacer would be an option for her.  Dr. Craige Cotta, pls advise.  Thank you.

## 2012-11-30 NOTE — Telephone Encounter (Signed)
Pt is aware of VS recommendation. Spacer will be sent to [facility] per her request. Nothing further was needed at this time.

## 2012-11-30 NOTE — Telephone Encounter (Signed)
Please have her resume Qvar, but one puff twice per day.  Please send prescription for spacer device to be used with her inhaler.  She should call back if she still has difficulties.

## 2013-01-14 ENCOUNTER — Telehealth: Payer: Self-pay | Admitting: Pulmonary Disease

## 2013-01-14 MED ORDER — AEROCHAMBER MV MISC: Status: AC

## 2013-01-14 NOTE — Telephone Encounter (Signed)
Called and spoke with pt and she is requesting to have an aero chamber for her inhaler.  We can do this through the office---we do have some here, and we can do the RX through [facility].  Pt is aware.

## 2013-01-14 NOTE — Telephone Encounter (Signed)
Okay to arrange for aerochamber.  Please select easiest, least expensive option for pt >> either get from our office or send through [facility].

## 2013-01-14 NOTE — Telephone Encounter (Signed)
Patient coming to our office to pick up AeroChamber-- placed up front along with [facility] form and Rx to be signed by VS. Patient aware that AeroChamber will be explained to her when she gets here.  Rx will need to be placed in VS look-at once patient given device.   Will send to Dr Craige Cotta Nurse Lillia Abed to make her aware that this Rx needs to be signed and returned to [facility]. Rx in VS look-at. Thanks!

## 2013-03-28 ENCOUNTER — Ambulatory Visit: Payer: 59 | Admitting: Pulmonary Disease

## 2013-03-31 ENCOUNTER — Encounter: Payer: Self-pay | Admitting: Pulmonary Disease

## 2013-10-12 ENCOUNTER — Other Ambulatory Visit: Payer: Self-pay

## 2013-10-12 DIAGNOSIS — Z1231 Encounter for screening mammogram for malignant neoplasm of breast: Secondary | ICD-10-CM

## 2013-10-17 ENCOUNTER — Ambulatory Visit
Admission: RE | Admit: 2013-10-17 | Discharge: 2013-10-17 | Disposition: A | Discharge status: Home / Self Care | Payer: 59 | Source: Ambulatory Visit

## 2013-10-17 DIAGNOSIS — Z1231 Encounter for screening mammogram for malignant neoplasm of breast: Secondary | ICD-10-CM

## 2014-03-08 ENCOUNTER — Ambulatory Visit (INDEPENDENT_AMBULATORY_CARE_PROVIDER_SITE_OTHER): Payer: 59 | Admitting: Pulmonary Disease

## 2014-03-08 ENCOUNTER — Encounter: Payer: Self-pay | Admitting: Pulmonary Disease

## 2014-03-08 ENCOUNTER — Ambulatory Visit (INDEPENDENT_AMBULATORY_CARE_PROVIDER_SITE_OTHER)
Admission: RE | Admit: 2014-03-08 | Discharge: 2014-03-08 | Disposition: A | Discharge status: Home / Self Care | Payer: 59 | Source: Ambulatory Visit | Attending: Pulmonary Disease | Admitting: Pulmonary Disease

## 2014-03-08 VITALS — BP 120/88 | HR 97 | Ht 64.0 in | Wt 257.6 lb

## 2014-03-08 DIAGNOSIS — R05 Cough: Secondary | ICD-10-CM

## 2014-03-08 DIAGNOSIS — R053 Chronic cough: Secondary | ICD-10-CM

## 2014-03-08 DIAGNOSIS — J329 Chronic sinusitis, unspecified: Secondary | ICD-10-CM

## 2014-03-08 DIAGNOSIS — J45998 Other asthma: Secondary | ICD-10-CM

## 2014-03-08 MED ORDER — MONTELUKAST SODIUM 10 MG PO TABS: 10.0000 mg | ORAL_TABLET | Freq: Every day | ORAL | Status: DC

## 2014-03-08 NOTE — Progress Notes (Signed)
  Chief Complaint  Patient presents with  . Follow-up    Pt reports that she had difficulty with breathing this morning. States that she could not get a full breath this morning--lungs would not expand. Pt states that she coughed up a lot of mucus right after this happened.     History of Present Illness: Michele Reyes is a 63 y.o. female former smoker with chronic cough from asthma and post-nasal drip.  She continues to have cough and sputum.  She felt like her lung stuck together this AM from cough and sputum.  She has not been using proair.  She did not feel like Qvar or nasal steroids helped.  She gets frequent sinus infections.  She is concerned about being on Abx too much.  She thinks her sinus infections are related to dental work.  She tried oil of oregano and this helped.  She has also tried citric seed oil.  She still has sinus congestion and post-nasal drip.  She gets occasional wheeze.  She has a raspy voice.  She denies fever, hemoptysis, chest pain, skin rash, or leg swelling.  Tests: Spirometry 09/29/08 >> FEV1 2.55(100%), FEV1% 72 11/16/08 IgE 45 CT sinus 01/24/10>>b/l maxillary sinusitis CXR 01/23/12>>hyperinflation, peribronchial thickening PFT 02/25/12>>FEV1 2.41 (105%), FEV1% 63, TLC 5.28 (108%), DLCO 75%, no BD Spirometry 08/10/12 >> FEV1 1.95 (79%), FEV1% 69  PMHx >> Headache, HTN, Psoriasis  PSHx, Medications, Allergies, Fhx, Shx reviewed.  Physical Exam: Blood pressure 120/88, pulse 97, height 5\' 4"  (1.626 m), weight 257 lb 9.6 oz (116.847 kg), SpO2 96 %. Body mass index is 44.2 kg/(m^2).  General - No distress ENT - No sinus tenderness, no oral exudate, no LAN Cardiac - s1s2 regular, no murmur Chest - scattered rhonchi, no wheeze/rales Back - No focal tenderness Abd - Soft, non-tender Ext - No edema Neuro - Normal strength Skin - No rashes Psych - Normal mood, and behavior    Assessment/Plan:  Chronic cough related to post-nasal drip and  asthma. Plan: - will check CXR today  Persistent asthma with worsening symptoms. She did not notice benefit from using ICS. Plan: - will have her try montelukast - advised her to try using proair more when she has symptoms  Recurrent sinusitis. She did not notice benefit from using inhaled nasal steroids. Plan: - advised she can continue using oregano oil topically or ingested, but should not inhale this - montelukast should help with this also   Chesley Mires, MD [place] [facility]/Critical Care/Sleep Pager:  [phone] 03/08/2014, 9:54 AM

## 2014-03-08 NOTE — Patient Instructions (Signed)
Montelukast 10 mg pill nightly before bedtime Proair two puffs up to four times per day as needed Chest xray today Follow up in 4 months

## 2014-03-09 ENCOUNTER — Telehealth: Payer: Self-pay | Admitting: Pulmonary Disease

## 2014-03-09 NOTE — Telephone Encounter (Signed)
Dg Chest 2 View  03/08/2014   CLINICAL DATA:  Productive cough, history of smoking  EXAM: CHEST  2 VIEW  COMPARISON:  01/23/2012  FINDINGS: Cardiac shadow is within normal limits. The lungs are hyperinflated consistent with COPD. No focal infiltrate or sizable effusion is seen. No bony abnormality is noted.  IMPRESSION: COPD without acute abnormality.   Electronically Signed   By: Inez Catalina M.D.   On: 03/08/2014 11:51    Will have my nurse inform pt that CXR shows expected changes from asthma.  No change to current tx plan.

## 2014-03-10 NOTE — Telephone Encounter (Signed)
Results have been explained to patient, pt expressed understanding. Nothing further needed.  

## 2014-12-05 ENCOUNTER — Encounter: Payer: 59 | Attending: Obstetrics and Gynecology | Admitting: Dietician

## 2014-12-05 ENCOUNTER — Encounter: Payer: Self-pay | Admitting: Dietician

## 2014-12-05 VITALS — Ht 64.0 in | Wt 225.4 lb

## 2014-12-05 DIAGNOSIS — Z713 Dietary counseling and surveillance: Secondary | ICD-10-CM | POA: Insufficient documentation

## 2014-12-05 DIAGNOSIS — R7309 Other abnormal glucose: Secondary | ICD-10-CM | POA: Insufficient documentation

## 2014-12-05 DIAGNOSIS — R7303 Prediabetes: Secondary | ICD-10-CM

## 2014-12-05 NOTE — Progress Notes (Signed)
  Medical Nutrition Therapy:  Appt start time: 0910 end time:  1015.   Assessment:  Primary concerns today: Michele Reyes is here today since has pre-diabetes. Has a Hgb A1c of 5.8% in July. Tends to not have "good eating habits" when she is under stress, especially d/t lack of time. Stress is better now that it was. Just finished her degree and mother was living with her. Work was also stressful. Weight is down about 40 lbs from December. When under stress goes to drive thrus more often. Stress has led to emotional eating (sweets, fried food etc).  Likes to eat locally, organically, and healthy fats when possible. Tries to avoid sugar. Feels like she eats pretty well most of the time. Tries to eat frugally - doesn't buy fish often.   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 Lives by herself. Does her food shopping and meal preparation. Misses meals when she is busy. Would like to increase physical active, energy, stamina, and strength.   Preferred Learning Style:   No preference indicated   Learning Readiness:   Ready  MEDICATIONS: see list   DIETARY INTAKE:  Usual eating pattern includes 2-3 meals and 2-3 snacks per day.  Avoided foods include: liver    24-hr recall:  B ( AM): protein shake - protein powder, coconut milk, 2 cups of fruit, kale, flax seed, turmeric Snk ( AM): none or might have nuts or dried nuts L ( PM): drive thru salad grilled chicken, lettuce, blue cheese, onions, bread, dressing or chicken fil a grilled market salad (with fruit, dried fruit, and granola) Snk ( PM): pastries at work or dried fruit/nuts D ( PM): steamed rice with veggies and chicken/hamburger Snk ( PM):frozen banana with chocolate or "grazing" while sitting at home Beverages: coffee, water, unsweet tea or a little sweet tea  Usual physical activity: tries to walk every day for 30 minutes though doesn't always do it  Estimated energy needs: 1600  calories 180 g carbohydrates 120 g protein 44 g fat  Progress Towards Goal(s):  In progress.   Nutritional Diagnosis:  [place]-2.1 Inpaired nutrition utilization As related to glucose metabolism.  As evidenced by Hgb A1c of 5.8%.    Intervention:  Nutrition counseling provided. Described the pathophysiology of insulin resistance and impact of food and exercise on blood sugar. Plan: Aim to eat 3 meals meal day and 2-3 snacks if you are hungry.  Fill half of your plate with vegetables, have about a handful (up to a cup) of carbohydrate (rice), and 3-4 oz of protein (deck of cards). Try to make meals last 20 minutes. Chew 20-30 chews per bite. If you feeling hungry in your stomach, have seconds.   Have protein and carbohydrates together for snacks. (yogurt) Have 1-1.5 cups of fruit in smoothies. Aim to get 30 minutes of exercise (walking) most days.   Teaching Method Utilized:  Visual Auditory Hands on  Handouts given during visit include:  Living Well with Diabetes  Blood glucose monitoring  Meal Card  MyPlate Handout  15 g CHO snacks  Barriers to learning/adherence to lifestyle change: none  Demonstrated degree of understanding via:  Teach Back   Monitoring/Evaluation:  Dietary intake, exercise, and body weight prn.

## 2014-12-05 NOTE — Patient Instructions (Addendum)
Aim to eat 3 meals meal day and 2-3 snacks if you are hungry.  Fill half of your plate with vegetables, have about a handful (up to a cup) of carbohydrate (rice), and 3-4 oz of protein (deck of cards). Try to make meals last 20 minutes. Chew 20-30 chews per bite. If you feeling hungry in your stomach, have seconds.   Have protein and carbohydrates together for snacks. (yogurt) Have 1-1.5 cups of fruit in smoothies. Aim to get 30 minutes of exercise (walking) most days.

## 2014-12-18 ENCOUNTER — Ambulatory Visit (INDEPENDENT_AMBULATORY_CARE_PROVIDER_SITE_OTHER): Payer: 59 | Admitting: Pulmonary Disease

## 2014-12-18 ENCOUNTER — Other Ambulatory Visit (INDEPENDENT_AMBULATORY_CARE_PROVIDER_SITE_OTHER): Payer: 59

## 2014-12-18 ENCOUNTER — Encounter: Payer: Self-pay | Admitting: Pulmonary Disease

## 2014-12-18 VITALS — BP 124/70 | HR 74 | Temp 98.7°F | Ht 64.0 in | Wt 224.2 lb

## 2014-12-18 DIAGNOSIS — Z87891 Personal history of nicotine dependence: Secondary | ICD-10-CM

## 2014-12-18 DIAGNOSIS — R05 Cough: Secondary | ICD-10-CM

## 2014-12-18 DIAGNOSIS — R053 Chronic cough: Secondary | ICD-10-CM

## 2014-12-18 LAB — BASIC METABOLIC PANEL
BUN: 26 mg/dL — ABNORMAL HIGH (ref 6–23)
CO2: 27 mEq/L (ref 19–32)
Calcium: 9.6 mg/dL (ref 8.4–10.5)
Chloride: 104 mEq/L (ref 96–112)
Creatinine, Ser: 0.79 mg/dL (ref 0.40–1.20)
GFR: 78.24 mL/min (ref >60.00)
Glucose, Bld: 91 mg/dL (ref 70–99)
Potassium: 4.4 mEq/L (ref 3.5–5.1)
Sodium: 139 mEq/L (ref 135–145)

## 2014-12-18 NOTE — Progress Notes (Signed)
  Chief Complaint  Patient presents with  . Follow-up    pt states she still has a cough. pt states nothing seems to really work, shes not reall worse but also not better. pt feels like she will have this for the rest of her life, and maybe the causes of it are enviormental, and food. pt states she has a prod cough  white in color. pt has no c/o chest tightness, wheezing, or SOB.    History of Present Illness: Michele Reyes is a 64 y.o. female former smoker with chronic cough.  She still has cough.  She feels like this will never go away.  She brings up clear sputum.  She still has post nasal drip.  She denies wheeze.  She notices trouble with dust.    She tried ICS, singulair, nasal steroids, and prilosec.  None of these help.  Tests: Spirometry 09/29/08 >> FEV1 2.55(100%), FEV1% 72 11/16/08 IgE 45 CT sinus 01/24/10>>b/l maxillary sinusitis CXR 01/23/12>>hyperinflation, peribronchial thickening PFT 02/25/12>>FEV1 2.41 (105%), FEV1% 63, TLC 5.28 (108%), DLCO 75%, no BD Spirometry 08/10/12 >> FEV1 1.95 (79%), FEV1% 69  PMHx >> Headache, HTN, Psoriasis  PSHx, Medications, Allergies, Fhx, Shx reviewed.  Physical Exam: BP 124/70 mmHg  Pulse 74  Temp(Src) 98.7 F (37.1 C) (Oral)  Ht 5\' 4"  (1.626 m)  Wt 224 lb 3.2 oz (101.696 kg)  BMI 38.46 kg/m2  SpO2 97%  General - No distress ENT - No sinus tenderness, no oral exudate, no LAN Cardiac - s1s2 regular, no murmur Chest - scattered rhonchi, no wheeze/rales Back - No focal tenderness Abd - Soft, non-tender Ext - No edema Neuro - Normal strength Skin - No rashes Psych - Normal mood, and behavior    Assessment/Plan:  Chronic cough not improved with tx of sinuses, asthma, or reflux. Plan: - will repeat PFT and get CT chest and call her with results  Hx of tobacco abuse. Plan: - will assess with PFT and CT chest    Chesley Mires, MD [place] [facility]/Critical Care/Sleep Pager:  [phone] 12/18/2014, 9:17 AM

## 2014-12-18 NOTE — Patient Instructions (Signed)
Lab test today Will schedule pulmonary function test and CT chest Follow up in 4 months

## 2014-12-27 ENCOUNTER — Ambulatory Visit (INDEPENDENT_AMBULATORY_CARE_PROVIDER_SITE_OTHER)
Admission: RE | Admit: 2014-12-27 | Discharge: 2014-12-27 | Disposition: A | Discharge status: Home / Self Care | Payer: 59 | Source: Ambulatory Visit | Attending: Pulmonary Disease | Admitting: Pulmonary Disease

## 2014-12-27 DIAGNOSIS — R053 Chronic cough: Secondary | ICD-10-CM

## 2014-12-27 DIAGNOSIS — R05 Cough: Secondary | ICD-10-CM | POA: Diagnosis not present

## 2014-12-27 DIAGNOSIS — Z87891 Personal history of nicotine dependence: Secondary | ICD-10-CM

## 2015-01-02 ENCOUNTER — Ambulatory Visit (INDEPENDENT_AMBULATORY_CARE_PROVIDER_SITE_OTHER): Payer: 59 | Admitting: Pulmonary Disease

## 2015-01-02 DIAGNOSIS — Z87891 Personal history of nicotine dependence: Secondary | ICD-10-CM

## 2015-01-02 DIAGNOSIS — R05 Cough: Secondary | ICD-10-CM | POA: Diagnosis not present

## 2015-01-02 DIAGNOSIS — R053 Chronic cough: Secondary | ICD-10-CM

## 2015-01-02 LAB — PULMONARY FUNCTION TEST
DL/VA % pred: 83 %
DL/VA: 4.02 ml/min/mmHg/L
DLCO unc % pred: 81 %
DLCO unc: 19.87 ml/min/mmHg
FEF 25-75 Post: 2.06 L/sec
FEF 25-75 Pre: 1.8 L/sec
FEF2575-%Change-Post: 14 %
FEF2575-%Pred-Post: 92 %
FEF2575-%Pred-Pre: 80 %
FEV1-%Change-Post: 3 %
FEV1-%Pred-Post: 97 %
FEV1-%Pred-Pre: 93 %
FEV1-Post: 2.43 L
FEV1-Pre: 2.33 L
FEV1FVC-%Change-Post: 0 %
FEV1FVC-%Pred-Pre: 94 %
FEV6-%Change-Post: 4 %
FEV6-%Pred-Post: 105 %
FEV6-%Pred-Pre: 101 %
FEV6-Post: 3.29 L
FEV6-Pre: 3.16 L
FEV6FVC-%Pred-Post: 104 %
FEV6FVC-%Pred-Pre: 104 %
FVC-%Change-Post: 4 %
FVC-%Pred-Post: 101 %
FVC-%Pred-Pre: 97 %
FVC-Post: 3.29 L
FVC-Pre: 3.16 L
Post FEV1/FVC ratio: 74 %
Post FEV6/FVC ratio: 100 %
Pre FEV1/FVC ratio: 74 %
Pre FEV6/FVC Ratio: 100 %

## 2015-01-02 NOTE — Progress Notes (Signed)
PFT done today. Preethi Scantlebury,CMA  

## 2015-01-05 ENCOUNTER — Telehealth: Payer: Self-pay | Admitting: Pulmonary Disease

## 2015-01-05 DIAGNOSIS — J439 Emphysema, unspecified: Secondary | ICD-10-CM

## 2015-01-05 MED ORDER — TIOTROPIUM BROMIDE MONOHYDRATE 18 MCG IN CAPS: 18.0000 ug | ORAL_CAPSULE | Freq: Every day | RESPIRATORY_TRACT | Status: DC

## 2015-01-05 NOTE — Telephone Encounter (Signed)
CT chest 12/27/14 >> mild emphysema, ATX RLL  PFT 01/02/15 >> FEV1 2.43 (97%), FEV1% 74, TLC 5.02 (99%), DLCO 81%, no BD   Spoke with pt over phone.  Explained she has mild emphysema, but PFT normal.  She is still having cough.  Will try her on spiriva.  She will need repeat CXR at next ROV.

## 2015-02-28 ENCOUNTER — Telehealth: Payer: Self-pay | Admitting: Pulmonary Disease

## 2015-02-28 NOTE — Telephone Encounter (Signed)
Called spoke with pt. Aware of recs.  Nothing further needed 

## 2015-02-28 NOTE — Telephone Encounter (Signed)
She can stop spiriva if she does not feel this is helping.  She can try using nasal irrigation, and OTC nasacort two sprays in each nostril >> this should help post nasal drip and cough.

## 2015-02-28 NOTE — Telephone Encounter (Signed)
Called spoke with pt. She reports she has been on spiriva for about 1 1/2 months now. She feels her cough is worse now. She reports she does cough is white phlem and cough is worse at night. She does c/o PND, sneezing itchy nasal passages. She wants to know if Dr. Halford Chessman also thinks if she should stop the spiriva. Please advise Dr. Halford Chessman thanks

## 2015-03-06 ENCOUNTER — Telehealth: Payer: Self-pay | Admitting: Pulmonary Disease

## 2015-03-06 DIAGNOSIS — R05 Cough: Secondary | ICD-10-CM

## 2015-03-06 DIAGNOSIS — R053 Chronic cough: Secondary | ICD-10-CM

## 2015-03-06 MED ORDER — ALBUTEROL SULFATE HFA 108 (90 BASE) MCG/ACT IN AERS: 2.0000 | INHALATION_SPRAY | Freq: Four times a day (QID) | RESPIRATORY_TRACT | Status: AC | PRN

## 2015-03-06 NOTE — Telephone Encounter (Signed)
Spoke with pt. States that she needs a refill on ProAir. Rx has been sent in. Nothing further was needed.

## 2015-03-07 ENCOUNTER — Other Ambulatory Visit: Payer: Self-pay | Admitting: Pulmonary Disease

## 2015-03-07 MED ORDER — TIOTROPIUM BROMIDE MONOHYDRATE 18 MCG IN CAPS: 18.0000 ug | ORAL_CAPSULE | Freq: Every day | RESPIRATORY_TRACT | Status: DC

## 2015-05-23 ENCOUNTER — Encounter: Payer: Self-pay | Admitting: Pulmonary Disease

## 2015-05-23 ENCOUNTER — Ambulatory Visit (INDEPENDENT_AMBULATORY_CARE_PROVIDER_SITE_OTHER): Payer: 59 | Admitting: Pulmonary Disease

## 2015-05-23 VITALS — BP 118/78 | HR 72 | Ht 64.0 in | Wt 220.0 lb

## 2015-05-23 DIAGNOSIS — R053 Chronic cough: Secondary | ICD-10-CM

## 2015-05-23 DIAGNOSIS — R05 Cough: Secondary | ICD-10-CM | POA: Diagnosis not present

## 2015-05-23 NOTE — Progress Notes (Signed)
Current Outpatient Prescriptions on File Prior to Visit  Medication Sig  . albuterol (PROAIR HFA) 108 (90 BASE) MCG/ACT inhaler Inhale 2 puffs into the lungs every 6 (six) hours as needed for wheezing.  . Cholecalciferol (VITAMIN D3) 5000 UNITS CAPS Take by mouth.  Marland Kitchen ibuprofen (ADVIL,MOTRIN) 200 MG tablet Take 200 mg by mouth every 6 (six) hours as needed for pain.  Marland Kitchen losartan-hydrochlorothiazide (HYZAAR) 50-12.5 MG per tablet Take 1 tablet by mouth daily.  . NON FORMULARY VEGA SHAKE - veggies and supplements  . Omega-3 Fatty Acids (FISH OIL TRIPLE STRENGTH) 1400 MG CAPS Take 1 capsule by mouth daily.  Marland Kitchen Spacer/Aero-Holding Chambers (AEROCHAMBER MV) inhaler Use as instructed  . TURMERIC PO Take by mouth daily. Mixed in with shake  . vitamin B-12 (CYANOCOBALAMIN) 500 MCG tablet Take 500 mcg by mouth daily.   No current facility-administered medications on file prior to visit.    Chief Complaint  Patient presents with  . Follow-up    Pt states that since being seen her cough had worsened for a short period of time but is now improved. Pt is no longer taking Spiriva.  Pt has been drinking a shake daily with Tumeric added.      Tests Spirometry 09/29/08 >> FEV1 2.55(100%), FEV1% 72 11/16/08 IgE 45 CT sinus 01/24/10>>b/l maxillary sinusitis CXR 01/23/12>>hyperinflation, peribronchial thickening PFT 02/25/12>>FEV1 2.41 (105%), FEV1% 63, TLC 5.28 (108%), DLCO 75%, no BD Spirometry 08/10/12 >> FEV1 1.95 (79%), FEV1% 69 CT chest 12/27/14 >> mild emphysema, ATX RLL PFT 01/02/15 >> FEV1 2.43 (97%), FEV1% 74, TLC 5.02 (99%), DLCO 81%, no BD  Past medical history Headache, HTN, Psoriasis  PSHx, Medications, Allergies, Fhx, Shx reviewed.  Vital signs BP 118/78 mmHg  Pulse 72  Ht 5\' 4"  (1.626 m)  Wt 220 lb (99.791 kg)  BMI 37.74 kg/m2  SpO2 97%  History of Present Illness: Michele Reyes is a 65 y.o. female former smoker with chronic cough.  Her cough is much better.  She started  taking tumeric in a shake, and with this her cough is almost gone.  She only sparingly needs to use proair.  She tried spiriva, but no help.  She takes advil several times per week for headache around her right eye >> this usually happens in mid morning.    Physical Exam:  General - No distress ENT - No sinus tenderness, no oral exudate, no LAN Cardiac - s1s2 regular, no murmur Chest - scattered rhonchi, no wheeze/rales Back - No focal tenderness Abd - Soft, non-tender Ext - No edema Neuro - Normal strength Skin - No rashes Psych - Normal mood, and behavior    Assessment/Plan:  Chronic cough. Plan: - controlled with tumeric and prn proair  Mild emphysema changes on CT chest >> PFT was normal. Plan: - monitor clinically  Headache. Plan: - advised her to d/w her PCP   Patient Instructions  Follow up as needed    Chesley Mires, MD [facility] Pager:  [phone] 05/23/2015, 4:42 PM

## 2015-05-23 NOTE — Patient Instructions (Signed)
Follow up as needed

## 2015-11-14 ENCOUNTER — Other Ambulatory Visit: Payer: Self-pay | Admitting: Obstetrics and Gynecology

## 2015-11-14 DIAGNOSIS — R928 Other abnormal and inconclusive findings on diagnostic imaging of breast: Secondary | ICD-10-CM

## 2015-11-19 ENCOUNTER — Ambulatory Visit
Admission: RE | Admit: 2015-11-19 | Discharge: 2015-11-19 | Disposition: A | Discharge status: Home / Self Care | Payer: 59 | Source: Ambulatory Visit | Attending: Obstetrics and Gynecology | Admitting: Obstetrics and Gynecology

## 2015-11-19 DIAGNOSIS — R928 Other abnormal and inconclusive findings on diagnostic imaging of breast: Secondary | ICD-10-CM

## 2016-12-08 ENCOUNTER — Ambulatory Visit (INDEPENDENT_AMBULATORY_CARE_PROVIDER_SITE_OTHER)
Admission: RE | Admit: 2016-12-08 | Discharge: 2016-12-08 | Disposition: A | Discharge status: Home / Self Care | Payer: 59 | Source: Ambulatory Visit | Attending: Pulmonary Disease | Admitting: Pulmonary Disease

## 2016-12-08 ENCOUNTER — Telehealth: Payer: Self-pay | Admitting: Pulmonary Disease

## 2016-12-08 ENCOUNTER — Encounter: Payer: Self-pay | Admitting: Pulmonary Disease

## 2016-12-08 ENCOUNTER — Other Ambulatory Visit (INDEPENDENT_AMBULATORY_CARE_PROVIDER_SITE_OTHER): Payer: 59

## 2016-12-08 ENCOUNTER — Ambulatory Visit (INDEPENDENT_AMBULATORY_CARE_PROVIDER_SITE_OTHER): Payer: 59 | Admitting: Pulmonary Disease

## 2016-12-08 VITALS — BP 122/80 | HR 73 | Ht 64.0 in | Wt 234.2 lb

## 2016-12-08 DIAGNOSIS — J45909 Unspecified asthma, uncomplicated: Secondary | ICD-10-CM

## 2016-12-08 DIAGNOSIS — J329 Chronic sinusitis, unspecified: Secondary | ICD-10-CM

## 2016-12-08 LAB — COMPREHENSIVE METABOLIC PANEL
ALT: 14 U/L (ref 0–35)
AST: 15 U/L (ref 0–37)
Albumin: 4 g/dL (ref 3.5–5.2)
Alkaline Phosphatase: 67 U/L (ref 39–117)
BUN: 32 mg/dL — ABNORMAL HIGH (ref 6–23)
CO2: 27 mEq/L (ref 19–32)
Calcium: 9.4 mg/dL (ref 8.4–10.5)
Chloride: 103 mEq/L (ref 96–112)
Creatinine, Ser: 1.08 mg/dL (ref 0.40–1.20)
GFR: 54.2 mL/min — ABNORMAL LOW (ref >60.00)
Glucose, Bld: 89 mg/dL (ref 70–99)
Potassium: 3.9 mEq/L (ref 3.5–5.1)
Sodium: 138 mEq/L (ref 135–145)
Total Bilirubin: 0.3 mg/dL (ref 0.2–1.2)
Total Protein: 7.2 g/dL (ref 6.0–8.3)

## 2016-12-08 LAB — CBC WITH DIFFERENTIAL/PLATELET
Basophils Absolute: 0.1 10*3/uL (ref 0.0–0.1)
Basophils Relative: 1 % (ref 0.0–3.0)
Eosinophils Absolute: 0 10*3/uL (ref 0.0–0.7)
Eosinophils Relative: 0.4 % (ref 0.0–5.0)
HCT: 41.5 % (ref 36.0–46.0)
Hemoglobin: 13.8 g/dL (ref 12.0–15.0)
Lymphocytes Relative: 32.5 % (ref 12.0–46.0)
Lymphs Abs: 3 10*3/uL (ref 0.7–4.0)
MCHC: 33.3 g/dL (ref 30.0–36.0)
MCV: 90 fl (ref 78.0–100.0)
Monocytes Absolute: 0.8 10*3/uL (ref 0.1–1.0)
Monocytes Relative: 8.8 % (ref 3.0–12.0)
Neutro Abs: 5.2 10*3/uL (ref 1.4–7.7)
Neutrophils Relative %: 57.3 % (ref 43.0–77.0)
Platelets: 279 10*3/uL (ref 150.0–400.0)
RBC: 4.61 Mil/uL (ref 3.87–5.11)
RDW: 14.1 % (ref 11.5–15.5)
WBC: 9.1 10*3/uL (ref 4.0–10.5)

## 2016-12-08 NOTE — Telephone Encounter (Signed)
Spoke with pt and advised of Dr Juanetta Gosling recommendations.  PT would like an ov to discuss this with Dr Halford Chessman.  Pt was given opening today at 4:00.  Nothing further needed.

## 2016-12-08 NOTE — Progress Notes (Signed)
Current Outpatient Prescriptions on File Prior to Visit  Medication Sig  . albuterol (PROAIR HFA) 108 (90 BASE) MCG/ACT inhaler Inhale 2 puffs into the lungs every 6 (six) hours as needed for wheezing.  . Cholecalciferol (VITAMIN D3) 5000 UNITS CAPS Take by mouth.  Marland Kitchen ibuprofen (ADVIL,MOTRIN) 200 MG tablet Take 200 mg by mouth every 6 (six) hours as needed for pain.  Marland Kitchen losartan-hydrochlorothiazide (HYZAAR) 50-12.5 MG per tablet Take 1 tablet by mouth daily.  . NON FORMULARY VEGA SHAKE - veggies and supplements  . TURMERIC PO Take by mouth daily. Mixed in with shake  . Spacer/Aero-Holding Chambers (AEROCHAMBER MV) inhaler Use as instructed (Patient not taking: Reported on 12/08/2016)   No current facility-administered medications on file prior to visit.     Chief Complaint  Patient presents with  . Follow-up    Pt states coughing was worsen since last visit, productive cough with coughing up white thick mucus.     Pulmonary tests Spirometry 09/29/08 >> FEV1 2.55(100%), FEV1% 72 11/16/08 IgE 45 CT sinus 01/24/10>>b/l maxillary sinusitis CXR 01/23/12>>hyperinflation, peribronchial thickening PFT 02/25/12>>FEV1 2.41 (105%), FEV1% 63, TLC 5.28 (108%), DLCO 75%, no BD Spirometry 08/10/12 >> FEV1 1.95 (79%), FEV1% 69 CT chest 12/27/14 >> mild emphysema, ATX RLL PFT 01/02/15 >> FEV1 2.43 (97%), FEV1% 74, TLC 5.02 (99%), DLCO 81%, no BD  Past medical history Headache, HTN, Psoriasis  Past surgical history, Family history, Social history, Allergies reviewed  Vital signs BP 122/80 (BP Location: Left Arm, Cuff Size: Normal)   Pulse 73   Ht 5\' 4"  (1.626 m)   Wt 234 lb 3.2 oz (106.2 kg)   SpO2 93%   BMI 40.20 kg/m   History of Present Illness: Michele Reyes is a 66 y.o. female former smoker with chronic cough.  I last saw her in March 2017.  She has been tried on spiriva, zyrtec, singulair, qvar before.  She has also tried flonase and nasal irrigation.  None of these therapies  worked, and she was worried about side effects without any significant benefit.  She has a coworker who was started on injection medication for asthma, and she wants to find out if she is a candidate for one of these.  She gets sinus congestion and post nasal drip.  This seems to happen more around dust and at night in bed.  She then gets a cough with raspy voice.  She brings up clear sputum.  She is not having fever, chest pain, skin rash, GI symptoms, or leg swelling.  Physical Exam:  General - pleasant Eyes - pupils reactive ENT - no sinus tenderness, no oral exudate, no LAN, raspy voice with intermittent cough Cardiac - regular, no murmur Chest - no wheeze, rales Abd - soft, non tender Ext - no edema Skin - no rashes Neuro - normal strength Psych - normal mood   Assessment/Plan:  Chronic cough from allergic asthma and chronic rhinosinusitis. - she has not had benefit from usual therapies for this - will check CXR, RAST, IgE, CBC with differential, CMET, and PFT - based on results will determine if she is a candidate for biologic agent (Xolair, Sallyanne Havers)   Patient Instructions  Lab test and chest xray today  Will schedule pulmonary function test  Follow up in 4 weeks with Dr. Halford Chessman or Nurse Practitioner   Chesley Mires, MD [place] Pager:  [phone] 12/08/2016, 5:20 PM

## 2016-12-08 NOTE — Patient Instructions (Signed)
Lab test and chest xray today  Will schedule pulmonary function test  Follow up in 4 weeks with Dr. Halford Chessman or Nurse Practitioner

## 2016-12-08 NOTE — Telephone Encounter (Signed)
She is likely referring to one of the biologic agents for severe allergic asthma - Xolair, [place], Fasenra, or Nucala.  These types of medications are only helpful for certain asthma patients.    We could schedule her for an ROV if she would like to discuss this more.  However, based on her status at last visit in March 2017 I don't think this would be a necessary option for her.

## 2016-12-08 NOTE — Telephone Encounter (Signed)
Spoke with pt, she states she received some information from a co-worker about treatment for his asthma. He takes a shot once a month and states there are several medications similar to the one he takes but the pt doesn't know what the name of the medication is. Dr. Halford Chessman please Suezanne Cheshire if you know what pt is referring to. Thanks

## 2016-12-08 NOTE — Progress Notes (Signed)
@Patient  ID: Michele Reyes, female    DOB: Mar 25, 1950, 66 y.o.   MRN: [id number]  Chief Complaint  Patient presents with  . Follow-up    Pt states coughing was worsen since last visit, productive cough with coughing up white thick mucus.    Referring provider: Josefa Half*  HPI: 66 year old female former smoker followed for chronic cough, mild emphysematous changes on CT chest with normal pulmonary function test    Tests Spirometry 09/29/08 >> FEV1 2.55(100%), FEV1% 72 11/16/08 IgE 45 CT sinus 01/24/10>>b/l maxillary sinusitis CXR 01/23/12>>hyperinflation, peribronchial thickening PFT 02/25/12>>FEV1 2.41 (105%), FEV1% 63, TLC 5.28 (108%), DLCO 75%, no BD Spirometry 08/10/12 >> FEV1 1.95 (79%), FEV1% 69 CT chest 12/27/14 >> mild emphysema, ATX RLL PFT 01/02/15 >> FEV1 2.43 (97%), FEV1% 74, TLC 5.02 (99%), DLCO 81%, no BD  12/08/2016 Follow up ; Chronic cough  Patient presents for follow-up. Patient was last seen March 2017   Allergies  Allergen Reactions  . Levaquin [Levofloxacin] Hives  . Penicillins     REACTION: hives     There is no immunization history on file for this patient.  Past Medical History:  Diagnosis Date  . Asthma   . Headache(784.0)   . Hypertension   . Psoriasis   . Rhinitis     Tobacco History: History  Smoking Status  . Former Smoker  . Packs/day: 2.00  . Years: 10.00  . Types: Cigarettes  . Quit date: 03/24/1993  Smokeless Tobacco  . Never Used   Counseling given: Not Answered   Outpatient Encounter Prescriptions as of 12/08/2016  Medication Sig  . albuterol (PROAIR HFA) 108 (90 BASE) MCG/ACT inhaler Inhale 2 puffs into the lungs every 6 (six) hours as needed for wheezing.  . Cholecalciferol (VITAMIN D3) 5000 UNITS CAPS Take by mouth.  Marland Kitchen ibuprofen (ADVIL,MOTRIN) 200 MG tablet Take 200 mg by mouth every 6 (six) hours as needed for pain.  Marland Kitchen losartan-hydrochlorothiazide (HYZAAR) 50-12.5 MG per tablet Take 1 tablet by  mouth daily.  . NON FORMULARY VEGA SHAKE - veggies and supplements  . TURMERIC PO Take by mouth daily. Mixed in with shake  . Spacer/Aero-Holding Chambers (AEROCHAMBER MV) inhaler Use as instructed (Patient not taking: Reported on 12/08/2016)  . [DISCONTINUED] buPROPion (WELLBUTRIN XL) 300 MG 24 hr tablet Take 300 mg by mouth daily.  . [DISCONTINUED] Omega-3 Fatty Acids (FISH OIL TRIPLE STRENGTH) 1400 MG CAPS Take 1 capsule by mouth daily.  . [DISCONTINUED] vitamin B-12 (CYANOCOBALAMIN) 500 MCG tablet Take 500 mcg by mouth daily.   No facility-administered encounter medications on file as of 12/08/2016.      Review of Systems  Constitutional:   No  weight loss, night sweats,  Fevers, chills, fatigue, or  lassitude.  HEENT:   No headaches,  Difficulty swallowing,  Tooth/dental problems, or  Sore throat,                No sneezing, itching, ear ache, nasal congestion, post nasal drip,   CV:  No chest pain,  Orthopnea, PND, swelling in lower extremities, anasarca, dizziness, palpitations, syncope.   GI  No heartburn, indigestion, abdominal pain, nausea, vomiting, diarrhea, change in bowel habits, loss of appetite, bloody stools.   Resp: No shortness of breath with exertion or at rest.  No excess mucus, no productive cough,  No non-productive cough,  No coughing up of blood.  No change in color of mucus.  No wheezing.  No chest wall deformity  Skin: no rash or lesions.  GU: no dysuria, change in color of urine, no urgency or frequency.  No flank pain, no hematuria   MS:  No joint pain or swelling.  No decreased range of motion.  No back pain.    Physical Exam  BP 122/80 (BP Location: Left Arm, Cuff Size: Normal)   Pulse 73   Ht 5\' 4"  (1.626 m)   Wt 234 lb 3.2 oz (106.2 kg)   SpO2 93%   BMI 40.20 kg/m   GEN: A/Ox3; pleasant , NAD, well nourished    HEENT:  [place]/AT,  EACs-clear, TMs-wnl, NOSE-clear, THROAT-clear, no lesions, no postnasal drip or exudate noted.   NECK:  Supple w/  fair ROM; no JVD; normal carotid impulses w/o bruits; no thyromegaly or nodules palpated; no lymphadenopathy.    RESP  Clear  P & A; w/o, wheezes/ rales/ or rhonchi. no accessory muscle use, no dullness to percussion  CARD:  RRR, no m/r/g, no peripheral edema, pulses intact, no cyanosis or clubbing.  GI:   Soft & nt; nml bowel sounds; no organomegaly or masses detected.   Musco: Warm bil, no deformities or joint swelling noted.   Neuro: alert, no focal deficits noted.    Skin: Warm, no lesions or rashes    Lab Results:  CBC    Component Value Date/Time   WBC 7.3 11/16/2008 1234   RBC 4.71 11/16/2008 1234   HGB 14.3 11/16/2008 1234   HCT 42.7 11/16/2008 1234   PLT 254.0 11/16/2008 1234   MCV 90.7 11/16/2008 1234   MCHC 33.5 11/16/2008 1234   RDW 13.0 11/16/2008 1234   LYMPHSABS 2.4 11/16/2008 1234   MONOABS 0.6 11/16/2008 1234   EOSABS 0.1 11/16/2008 1234   BASOSABS 0.1 11/16/2008 1234    BMET    Component Value Date/Time   NA 139 12/18/2014 1018   K 4.4 12/18/2014 1018   CL 104 12/18/2014 1018   CO2 27 12/18/2014 1018   GLUCOSE 91 12/18/2014 1018   BUN 26 (H) 12/18/2014 1018   CREATININE 0.79 12/18/2014 1018   CALCIUM 9.6 12/18/2014 1018   GFRNONAA 78.73 11/16/2008 1234    BNP No results found for: BNP  ProBNP No results found for: PROBNP  Imaging: No results found.   Assessment & Plan:   No problem-specific Assessment & Plan notes found for this encounter.     Rexene Edison, NP 12/08/2016

## 2016-12-09 LAB — IGE: IgE (Immunoglobulin E), Serum: 99 kU/L (ref <=114)

## 2016-12-10 ENCOUNTER — Telehealth: Payer: Self-pay | Admitting: Pulmonary Disease

## 2016-12-10 NOTE — Telephone Encounter (Signed)
Dg Chest 2 View  Result Date: 12/08/2016 CLINICAL DATA:  Asthma EXAM: CHEST  2 VIEW COMPARISON:  CT 12/26/2016, radiograph 03/08/2014 FINDINGS: Hyper inflation. Scarring at the left lung base and lingula. No acute infiltrate or effusion. Normal cardiomediastinal silhouette. Aortic atherosclerosis. No pneumothorax. IMPRESSION: No active cardiopulmonary disease. Electronically Signed   By: Donavan Foil M.D.   On: 12/08/2016 19:23     Please inform pt that chest xray is normal.  Will call back once lab tests are finalized.

## 2016-12-11 NOTE — Telephone Encounter (Signed)
Called and spoke with patient, and informed her with the results per Dr. Halford Chessman. Patient verbalized understanding, nothing further needed at this time.

## 2016-12-15 ENCOUNTER — Telehealth: Payer: Self-pay | Admitting: Pulmonary Disease

## 2016-12-15 DIAGNOSIS — J4520 Mild intermittent asthma, uncomplicated: Secondary | ICD-10-CM

## 2016-12-15 NOTE — Addendum Note (Signed)
Addended by: Georjean Mode on: 12/15/2016 04:22 PM   Modules accepted: Orders

## 2016-12-15 NOTE — Telephone Encounter (Signed)
Called and spoke with patient to informed her of results and recommendations per Dr. Halford Chessman. Explained to patient that labs are normal, and the RAST labs need to be repeated this week. Placed orders for this lab test this afternoon, and the patient verbalized understanding and denied any questions and concerns at this time. Patient stated that she will have this lab test repeated within next 2 business days.

## 2016-12-15 NOTE — Telephone Encounter (Signed)
CMP Latest Ref Rng & Units 12/08/2016 12/18/2014 11/16/2008  Glucose 70 - 99 mg/dL 89 91 90  BUN 6 - 23 mg/dL 32(H) 26(H) 15  Creatinine 0.40 - 1.20 mg/dL 1.08 0.79 0.8  Sodium 135 - 145 mEq/L 138 139 142  Potassium 3.5 - 5.1 mEq/L 3.9 4.4 4.1  Chloride 96 - 112 mEq/L 103 104 107  CO2 19 - 32 mEq/L 27 27 29   Calcium 8.4 - 10.5 mg/dL 9.4 9.6 9.2  Total Protein 6.0 - 8.3 g/dL 7.2 - 7.2  Total Bilirubin 0.2 - 1.2 mg/dL 0.3 - 0.8  Alkaline Phos 39 - 117 U/L 67 - 76  AST 0 - 37 U/L 15 - 19  ALT 0 - 35 U/L 14 - 13    CBC Latest Ref Rng & Units 12/08/2016 11/16/2008  WBC 4.0 - 10.5 K/uL 9.1 7.3  Hemoglobin 12.0 - 15.0 g/dL 13.8 14.3  Hematocrit 36.0 - 46.0 % 41.5 42.7  Platelets 150.0 - 400.0 K/uL 279.0 254.0    IGE 12/08/16 >> 99   Please tell pt that labs so far have been normal.  There was a problem with one of the lab tests and this will need to be repeated.  Please arrange for RAST (Respiratory allergy profile) to be done.  Will call her with results of this.

## 2016-12-16 ENCOUNTER — Other Ambulatory Visit (INDEPENDENT_AMBULATORY_CARE_PROVIDER_SITE_OTHER): Payer: 59

## 2016-12-16 DIAGNOSIS — R05 Cough: Secondary | ICD-10-CM | POA: Diagnosis not present

## 2016-12-16 DIAGNOSIS — R059 Cough, unspecified: Secondary | ICD-10-CM

## 2016-12-16 DIAGNOSIS — J4520 Mild intermittent asthma, uncomplicated: Secondary | ICD-10-CM

## 2016-12-16 LAB — CBC WITH DIFFERENTIAL/PLATELET
Basophils Absolute: 0.1 10*3/uL (ref 0.0–0.1)
Basophils Relative: 1.1 % (ref 0.0–3.0)
Eosinophils Absolute: 0.1 10*3/uL (ref 0.0–0.7)
Eosinophils Relative: 0.9 % (ref 0.0–5.0)
HCT: 40.6 % (ref 36.0–46.0)
Hemoglobin: 13.6 g/dL (ref 12.0–15.0)
Lymphocytes Relative: 39.9 % (ref 12.0–46.0)
Lymphs Abs: 2.5 10*3/uL (ref 0.7–4.0)
MCHC: 33.6 g/dL (ref 30.0–36.0)
MCV: 89.7 fl (ref 78.0–100.0)
Monocytes Absolute: 0.6 10*3/uL (ref 0.1–1.0)
Monocytes Relative: 10.3 % (ref 3.0–12.0)
Neutro Abs: 2.9 10*3/uL (ref 1.4–7.7)
Neutrophils Relative %: 47.8 % (ref 43.0–77.0)
Platelets: 255 10*3/uL (ref 150.0–400.0)
RBC: 4.52 Mil/uL (ref 3.87–5.11)
RDW: 14.2 % (ref 11.5–15.5)
WBC: 6.1 10*3/uL (ref 4.0–10.5)

## 2016-12-17 LAB — RESPIRATORY ALLERGY PROFILE REGION II ~~LOC~~

## 2016-12-17 LAB — INTERPRETATION:

## 2016-12-26 ENCOUNTER — Telehealth: Payer: Self-pay | Admitting: Pulmonary Disease

## 2016-12-26 NOTE — Telephone Encounter (Signed)
RAST 12/16/16 >> negative, IgE 111   Will have my nurse inform pt that allergy lab test was negative.  No change to current tx plan.

## 2016-12-30 NOTE — Telephone Encounter (Signed)
Called and spoke with patient today regarding results per VS.  Informed the patient of her results today and recommendations per VS. The patient verbalized understanding and denied any questions or concerns at this time. Nothing further needed. 

## 2017-01-06 ENCOUNTER — Ambulatory Visit (INDEPENDENT_AMBULATORY_CARE_PROVIDER_SITE_OTHER): Payer: 59 | Admitting: Adult Health

## 2017-01-06 ENCOUNTER — Encounter: Payer: Self-pay | Admitting: Adult Health

## 2017-01-06 DIAGNOSIS — J4520 Mild intermittent asthma, uncomplicated: Secondary | ICD-10-CM | POA: Diagnosis not present

## 2017-01-06 DIAGNOSIS — J310 Chronic rhinitis: Secondary | ICD-10-CM | POA: Diagnosis not present

## 2017-01-06 DIAGNOSIS — J329 Chronic sinusitis, unspecified: Secondary | ICD-10-CM

## 2017-01-06 DIAGNOSIS — J45909 Unspecified asthma, uncomplicated: Secondary | ICD-10-CM

## 2017-01-06 DIAGNOSIS — R05 Cough: Secondary | ICD-10-CM

## 2017-01-06 DIAGNOSIS — R053 Chronic cough: Secondary | ICD-10-CM

## 2017-01-06 LAB — PULMONARY FUNCTION TEST
DL/VA % pred: 84 %
DL/VA: 4.06 ml/min/mmHg/L
DLCO cor % pred: 78 %
DLCO cor: 19 ml/min/mmHg
DLCO unc % pred: 82 %
DLCO unc: 20.03 ml/min/mmHg
FEF 25-75 Post: 1.71 L/sec
FEF 25-75 Pre: 1.15 L/sec
FEF2575-%Change-Post: 48 %
FEF2575-%Pred-Post: 79 %
FEF2575-%Pred-Pre: 53 %
FEV1-%Change-Post: 12 %
FEV1-%Pred-Post: 87 %
FEV1-%Pred-Pre: 77 %
FEV1-Post: 2.13 L
FEV1-Pre: 1.89 L
FEV1FVC-%Change-Post: 7 %
FEV1FVC-%Pred-Pre: 87 %
FEV6-%Change-Post: 4 %
FEV6-%Pred-Post: 95 %
FEV6-%Pred-Pre: 91 %
FEV6-Post: 2.92 L
FEV6-Pre: 2.79 L
FEV6FVC-%Change-Post: 0 %
FEV6FVC-%Pred-Post: 104 %
FEV6FVC-%Pred-Pre: 103 %
FVC-%Change-Post: 4 %
FVC-%Pred-Post: 93 %
FVC-%Pred-Pre: 88 %
FVC-Post: 2.95 L
FVC-Pre: 2.82 L
Post FEV1/FVC ratio: 72 %
Post FEV6/FVC ratio: 100 %
Pre FEV1/FVC ratio: 67 %
Pre FEV6/FVC Ratio: 99 %
RV % pred: 125 %
RV: 2.61 L
TLC % pred: 109 %
TLC: 5.55 L

## 2017-01-06 MED ORDER — MOMETASONE FURO-FORMOTEROL FUM 100-5 MCG/ACT IN AERO: 2.0000 | INHALATION_SPRAY | Freq: Two times a day (BID) | RESPIRATORY_TRACT | 0 refills | Status: DC

## 2017-01-06 MED ORDER — MOMETASONE FURO-FORMOTEROL FUM 100-5 MCG/ACT IN AERO: 2.0000 | INHALATION_SPRAY | Freq: Two times a day (BID) | RESPIRATORY_TRACT | 3 refills | Status: DC

## 2017-01-06 NOTE — Progress Notes (Signed)
@Patient  ID: Michele Reyes, female    DOB: 09/15/1950, 66 y.o.   MRN: [id number]  Chief Complaint  Patient presents with  . Follow-up    Asthma     Referring provider: Josefa Half*  HPI: 66 year old female former smoker followed for allergic asthma, chronic rhinitis/sinusitis and chronic cough  Tests Spirometry 09/29/08 >> FEV1 2.55(100%), FEV1% 72 11/16/08 IgE 45 CT sinus 01/24/10>>b/l maxillary sinusitis CXR 01/23/12>>hyperinflation, peribronchial thickening PFT 02/25/12>>FEV1 2.41 (105%), FEV1% 63, TLC 5.28 (108%), DLCO 75%, no BD Spirometry 08/10/12 >> FEV1 1.95 (79%), FEV1% 69 CT chest 12/27/14 >> mild emphysema, ATX RLL PFT 01/02/15 >> FEV1 2.43 (97%), FEV1% 74, TLC 5.02 (99%), DLCO 81%, no BD  01/06/2017 Follow up ; Asthma  Patient returns for a one-month follow-up. Patient was seen last month for her asthma. She was having increased cough and sinus drainage. In the past. Patient had tried Advair, Spiriva, Zyrtec, Singulair, Qvar, Flonase without significant improvement. Says that she has had a cough for many years. Previous workup showed mild emphysema on CT chest.  PFT with no airflow obstruction or restriction.  Chest x-ray showed no acute process with scarring along the left lung base and lingula..  Labs showed a normal allergy profile. IgE was 99. CBC showed normal eosinophils. PFTs done today showed pre-bronchodilator mild airflow obstruction with an FEV1 at 77%, ratio 67, FVC 88, positive postbronchodilator response. Significant mid flow reversibility. DLCO 82%. Postbronchodilator with no airflow obstruction.    Allergies  Allergen Reactions  . Levaquin [Levofloxacin] Hives  . Penicillins     REACTION: hives     There is no immunization history on file for this patient.  Past Medical History:  Diagnosis Date  . Asthma   . Headache(784.0)   . Hypertension   . Psoriasis   . Rhinitis     Tobacco History: History  Smoking Status  . Former  Smoker  . Packs/day: 2.00  . Years: 10.00  . Types: Cigarettes  . Quit date: 03/24/1993  Smokeless Tobacco  . Never Used   Counseling given: Not Answered   Outpatient Encounter Prescriptions as of 01/06/2017  Medication Sig  . albuterol (PROAIR HFA) 108 (90 BASE) MCG/ACT inhaler Inhale 2 puffs into the lungs every 6 (six) hours as needed for wheezing.  . Cholecalciferol (VITAMIN D3) 5000 UNITS CAPS Take 5,000 Units by mouth daily.   Marland Kitchen ibuprofen (ADVIL,MOTRIN) 200 MG tablet Take 200 mg by mouth every 6 (six) hours as needed for pain.  Marland Kitchen losartan-hydrochlorothiazide (HYZAAR) 50-12.5 MG per tablet Take 1 tablet by mouth daily.  . medium chain triglycerides (MCT OIL) oil Take 15 mLs by mouth daily.  . NON FORMULARY [employer] shake powder - veggies and supplements  . Spacer/Aero-Holding Chambers (AEROCHAMBER MV) inhaler Use as instructed  . TURMERIC PO Take by mouth daily. Mixed in with shake   No facility-administered encounter medications on file as of 01/06/2017.      Review of Systems  Constitutional:   No  weight loss, night sweats,  Fevers, chills, fatigue, or  lassitude.  HEENT:   No headaches,  Difficulty swallowing,  Tooth/dental problems, or  Sore throat,                No sneezing, itching, ear ache, + nasal congestion, post nasal drip,   CV:  No chest pain,  Orthopnea, PND, swelling in lower extremities, anasarca, dizziness, palpitations, syncope.   GI  No  abdominal pain, nausea, vomiting, diarrhea, change in bowel habits,  loss of appetite, bloody stools. +GERD   Resp:   No chest wall deformity  Skin: no rash or lesions.  GU: no dysuria, change in color of urine, no urgency or frequency.  No flank pain, no hematuria   MS:  No joint pain or swelling.  No decreased range of motion.  No back pain.    Physical Exam  BP 130/70 (BP Location: Right Arm, Cuff Size: Normal)   Pulse 92   Ht 5\' 4"  (1.626 m)   Wt 235 lb (106.6 kg)   SpO2 92%   BMI 40.34 kg/m    GEN: A/Ox3; pleasant , NAD, well nourished    HEENT:  [place]/AT,  EACs-clear, TMs-wnl, NOSE-clear, THROAT-clear, no lesions, no postnasal drip or exudate noted.   NECK:  Supple w/ fair ROM; no JVD; normal carotid impulses w/o bruits; no thyromegaly or nodules palpated; no lymphadenopathy.    RESP  Clear  P & A; w/o, wheezes/ rales/ or rhonchi. no accessory muscle use, no dullness to percussion  CARD:  RRR, no m/r/g, no peripheral edema, pulses intact, no cyanosis or clubbing.  GI:   Soft & nt; nml bowel sounds; no organomegaly or masses detected.   Musco: Warm bil, no deformities or joint swelling noted.   Neuro: alert, no focal deficits noted.    Skin: Warm, no lesions or rashes    Lab Results:  CBC    Component Value Date/Time   WBC 6.1 12/16/2016 0823   RBC 4.52 12/16/2016 0823   HGB 13.6 12/16/2016 0823   HCT 40.6 12/16/2016 0823   PLT 255.0 12/16/2016 0823   MCV 89.7 12/16/2016 0823   MCHC 33.6 12/16/2016 0823   RDW 14.2 12/16/2016 0823   LYMPHSABS 2.5 12/16/2016 0823   MONOABS 0.6 12/16/2016 0823   EOSABS 0.1 12/16/2016 0823   BASOSABS 0.1 12/16/2016 0823    BMET    Component Value Date/Time   NA 138 12/08/2016 1705   K 3.9 12/08/2016 1705   CL 103 12/08/2016 1705   CO2 27 12/08/2016 1705   GLUCOSE 89 12/08/2016 1705   BUN 32 (H) 12/08/2016 1705   CREATININE 1.08 12/08/2016 1705   CALCIUM 9.4 12/08/2016 1705   GFRNONAA 78.73 11/16/2008 1234    BNP No results found for: BNP  ProBNP No results found for: PROBNP  Imaging: Dg Chest 2 View  Result Date: 12/08/2016 CLINICAL DATA:  Asthma EXAM: CHEST  2 VIEW COMPARISON:  CT 12/26/2016, radiograph 03/08/2014 FINDINGS: Hyper inflation. Scarring at the left lung base and lingula. No acute infiltrate or effusion. Normal cardiomediastinal silhouette. Aortic atherosclerosis. No pneumothorax. IMPRESSION: No active cardiopulmonary disease. Electronically Signed   By: Donavan Foil M.D.   On: 12/08/2016 19:23      Assessment & Plan:   Mild intermittent asthma PFTs are consistent with asthma component. Advised on trigger control. Trial of [facility]  Plan  Patient Instructions  Begin Dulera 2 puffs Twice daily  , rinse after use.  Begin Allegra 180mg  daily .  Begin Zantac 150mg  Twice daily   May use Delsym 2 tsp Twice daily  As needed  Cough .  Hold Krill oil for now.  Follow up with Dr. Halford Chessman in 2-3 months and As needed       Chronic rhinitis Chronic rhinitis /sinusitis  Add Allegra  Cont w/ saline nsaal rinses   Plan  Patient Instructions  Begin Dulera 2 puffs Twice daily  , rinse after use.  Begin Allegra 180mg  daily .  Begin  Zantac 150mg  Twice daily   May use Delsym 2 tsp Twice daily  As needed  Cough .  Hold Krill oil for now.  Follow up with Dr. Halford Chessman in 2-3 months and As needed       Chronic cough Control for triggers   Plan  Patient Instructions  Begin Dulera 2 puffs Twice daily  , rinse after use.  Begin Allegra 180mg  daily .  Begin Zantac 150mg  Twice daily   May use Delsym 2 tsp Twice daily  As needed  Cough .  Hold Krill oil for now.  Follow up with Dr. Halford Chessman in 2-3 months and As needed          Rexene Edison, NP 01/06/2017

## 2017-01-06 NOTE — Assessment & Plan Note (Signed)
Control for triggers   Plan  Patient Instructions  Begin Dulera 2 puffs Twice daily  , rinse after use.  Begin Allegra 180mg  daily .  Begin Zantac 150mg  Twice daily   May use Delsym 2 tsp Twice daily  As needed  Cough .  Hold Krill oil for now.  Follow up with Dr. Halford Chessman in 2-3 months and As needed

## 2017-01-06 NOTE — Assessment & Plan Note (Signed)
Chronic rhinitis /sinusitis  Add Allegra  Cont w/ saline nsaal rinses   Plan  Patient Instructions  Begin Dulera 2 puffs Twice daily  , rinse after use.  Begin Allegra 180mg  daily .  Begin Zantac 150mg  Twice daily   May use Delsym 2 tsp Twice daily  As needed  Cough .  Hold Krill oil for now.  Follow up with Dr. Halford Chessman in 2-3 months and As needed

## 2017-01-06 NOTE — Progress Notes (Signed)
I have reviewed and agree with assessment/plan.  Chesley Mires, MD [facility] Pulmonary/Critical Care 01/06/2017, 2:57 PM Pager:  [phone]

## 2017-01-06 NOTE — Patient Instructions (Signed)
PFT done today. 

## 2017-01-06 NOTE — Addendum Note (Signed)
Addended by: Parke Poisson E on: 01/06/2017 02:50 PM   Modules accepted: Orders

## 2017-01-06 NOTE — Patient Instructions (Addendum)
Begin Dulera 2 puffs Twice daily  , rinse after use.  Begin Allegra 180mg  daily .  Begin Zantac 150mg  Twice daily   May use Delsym 2 tsp Twice daily  As needed  Cough .  Hold Krill oil for now.  Follow up with Dr. Halford Chessman in 2-3 months and As needed

## 2017-01-06 NOTE — Assessment & Plan Note (Signed)
PFTs are consistent with asthma component. Advised on trigger control. Trial of [facility]  Plan  Patient Instructions  Begin Dulera 2 puffs Twice daily  , rinse after use.  Begin Allegra 180mg  daily .  Begin Zantac 150mg  Twice daily   May use Delsym 2 tsp Twice daily  As needed  Cough .  Hold Krill oil for now.  Follow up with Dr. Halford Chessman in 2-3 months and As needed

## 2017-01-06 NOTE — Progress Notes (Signed)
Patient seen in the office today and instructed on use of Dulera 100.  Patient expressed understanding and demonstrated technique. Parke Poisson, CMA 01/06/17

## 2017-01-09 ENCOUNTER — Telehealth: Payer: Self-pay | Admitting: Adult Health

## 2017-01-09 MED ORDER — BUDESONIDE-FORMOTEROL FUMARATE 80-4.5 MCG/ACT IN AERO: 2.0000 | INHALATION_SPRAY | Freq: Two times a day (BID) | RESPIRATORY_TRACT | 3 refills | Status: DC

## 2017-01-09 NOTE — Telephone Encounter (Signed)
Pt seen by TP on 10.16.18 and Dulera 100 was started Received fax from [facility] stating that Ruthe Mannan is not covered Covered alternatives: Advair 250, Advair HFA 115-21, Breo 100, Symbicort 160  Per TP: okay for Symbicort 80 2puffs twice daily  Called spoke with patient who is okay with this change She does mention that she's been on Advair and Symbicort in the past but Epic has no record of this.  Pt will try the Symbicort and let us know how she does with it.  She will finish her Dulera sample given at ov.  Med list updated Nothing further needed; will sign off

## 2017-04-14 ENCOUNTER — Other Ambulatory Visit: Payer: Self-pay

## 2017-04-21 ENCOUNTER — Telehealth: Payer: Self-pay | Admitting: Adult Health

## 2017-04-21 MED ORDER — BUDESONIDE-FORMOTEROL FUMARATE 80-4.5 MCG/ACT IN AERO: 2.0000 | INHALATION_SPRAY | Freq: Two times a day (BID) | RESPIRATORY_TRACT | 0 refills | Status: DC

## 2017-04-21 NOTE — Telephone Encounter (Signed)
Per the 10.19.18 phone note, patient was changed from [facility] to Symbicort 80 Received faxed request from [facility] requesting a 90 day supply  Last ov 10.16.18 with TP, recs to follow up with VS in 2-3 months No pending appt Rx sent x1 with note that patient is overdue for follow up

## 2017-06-22 DIAGNOSIS — M545 Low back pain: Secondary | ICD-10-CM | POA: Diagnosis not present

## 2017-06-22 DIAGNOSIS — M9904 Segmental and somatic dysfunction of sacral region: Secondary | ICD-10-CM | POA: Diagnosis not present

## 2017-06-22 DIAGNOSIS — M9903 Segmental and somatic dysfunction of lumbar region: Secondary | ICD-10-CM | POA: Diagnosis not present

## 2017-06-22 DIAGNOSIS — M5416 Radiculopathy, lumbar region: Secondary | ICD-10-CM | POA: Diagnosis not present

## 2017-06-24 DIAGNOSIS — M5416 Radiculopathy, lumbar region: Secondary | ICD-10-CM | POA: Diagnosis not present

## 2017-06-24 DIAGNOSIS — M545 Low back pain: Secondary | ICD-10-CM | POA: Diagnosis not present

## 2017-06-24 DIAGNOSIS — M9904 Segmental and somatic dysfunction of sacral region: Secondary | ICD-10-CM | POA: Diagnosis not present

## 2017-06-24 DIAGNOSIS — M9903 Segmental and somatic dysfunction of lumbar region: Secondary | ICD-10-CM | POA: Diagnosis not present

## 2017-06-26 DIAGNOSIS — M9903 Segmental and somatic dysfunction of lumbar region: Secondary | ICD-10-CM | POA: Diagnosis not present

## 2017-06-26 DIAGNOSIS — M9904 Segmental and somatic dysfunction of sacral region: Secondary | ICD-10-CM | POA: Diagnosis not present

## 2017-06-26 DIAGNOSIS — M5416 Radiculopathy, lumbar region: Secondary | ICD-10-CM | POA: Diagnosis not present

## 2017-06-26 DIAGNOSIS — M545 Low back pain: Secondary | ICD-10-CM | POA: Diagnosis not present

## 2017-07-14 ENCOUNTER — Other Ambulatory Visit: Payer: Self-pay | Admitting: Adult Health

## 2017-07-20 DIAGNOSIS — Z0000 Encounter for general adult medical examination without abnormal findings: Secondary | ICD-10-CM | POA: Diagnosis not present

## 2017-08-03 ENCOUNTER — Other Ambulatory Visit: Payer: Self-pay | Admitting: Family Medicine

## 2017-08-03 ENCOUNTER — Ambulatory Visit
Admission: RE | Admit: 2017-08-03 | Discharge: 2017-08-03 | Disposition: A | Discharge status: Home / Self Care | Payer: 59 | Source: Ambulatory Visit | Attending: Family Medicine | Admitting: Family Medicine

## 2017-08-03 DIAGNOSIS — I10 Essential (primary) hypertension: Secondary | ICD-10-CM | POA: Diagnosis not present

## 2017-08-03 DIAGNOSIS — G8929 Other chronic pain: Secondary | ICD-10-CM

## 2017-08-03 DIAGNOSIS — M25551 Pain in right hip: Secondary | ICD-10-CM | POA: Diagnosis not present

## 2017-08-03 DIAGNOSIS — Z91018 Allergy to other foods: Secondary | ICD-10-CM | POA: Diagnosis not present

## 2017-08-03 DIAGNOSIS — M545 Low back pain, unspecified: Secondary | ICD-10-CM

## 2017-08-03 DIAGNOSIS — Z23 Encounter for immunization: Secondary | ICD-10-CM | POA: Diagnosis not present

## 2017-08-03 DIAGNOSIS — M5136 Other intervertebral disc degeneration, lumbar region: Secondary | ICD-10-CM | POA: Diagnosis not present

## 2017-08-06 ENCOUNTER — Other Ambulatory Visit: Payer: Self-pay

## 2017-08-06 ENCOUNTER — Ambulatory Visit: Payer: PPO | Attending: Family Medicine | Admitting: Physical Therapy

## 2017-08-06 ENCOUNTER — Encounter: Payer: Self-pay | Admitting: Physical Therapy

## 2017-08-06 DIAGNOSIS — M545 Low back pain, unspecified: Secondary | ICD-10-CM

## 2017-08-06 DIAGNOSIS — M25551 Pain in right hip: Secondary | ICD-10-CM | POA: Diagnosis not present

## 2017-08-06 DIAGNOSIS — M6281 Muscle weakness (generalized): Secondary | ICD-10-CM | POA: Diagnosis not present

## 2017-08-06 NOTE — Therapy (Signed)
[facility] [address], [place], [place], [address] Phone: [phone]   Fax:  [phone]  Physical Therapy Evaluation  Patient Details  Name: Michele Reyes MRN: [id number] Date of Birth: 17-Dec-1950 Referring Provider: Bing Matter, PA   Encounter Date: 08/06/2017  PT End of Session - 08/06/17 1710    Visit Number  1    Date for PT Re-Evaluation  10/01/17    PT Start Time  2778    PT Stop Time  1610    PT Time Calculation (min)  40 min    Activity Tolerance  Patient tolerated treatment well    Behavior During Therapy  [facility] for tasks assessed/performed       Past Medical History:  Diagnosis Date  . Asthma   . Headache(784.0)   . Hypertension   . Psoriasis   . Rhinitis     Past Surgical History:  Procedure Laterality Date  . NASAL SINUS SURGERY  2001    There were no vitals filed for this visit.   Subjective Assessment - 08/06/17 1534    Subjective  Patient has had chronic back and right hip pain wiht sudden onset. Patient has eliminated inflammatory foods and taking Ibprofen.  Pain is better but still there.     Patient Stated Goals  not have to take Ibprufen daily, walk, not have to baby back    Currently in Pain?  Yes    Pain Score  5     Pain Location  Back    Pain Orientation  Lower    Pain Descriptors / Indicators  Constant;Aching;Dull    Pain Type  Chronic pain    Pain Onset  More than a month ago    Pain Frequency  Constant    Aggravating Factors   sleep, laying on side and is a side sleeper, first thing in the morning    Pain Relieving Factors  Ibprofen, movement helps the stiffness    Multiple Pain Sites  Yes    Pain Score  3    Pain Location  Hip    Pain Orientation  Right    Pain Descriptors / Indicators  Aching;Dull;Constant    Pain Type  Chronic pain    Pain Onset  More than a month ago    Pain Frequency  Constant    Aggravating Factors   sitting    Pain Relieving Factors   walking, standing         [facility] PT Assessment - 08/06/17 0001      Assessment   Medical Diagnosis  M54.5, 724.2, 338.29 chronic midline low back pain without sciatica; E42353614.43 right hip pain    Referring Provider  Bing Matter, PA    Onset Date/Surgical Date  03/24/17    Prior Therapy  chiropractor      Precautions   Precautions  None      Restrictions   Weight Bearing Restrictions  No      Balance Screen   Has the patient fallen in the past 6 months  Yes    How many times?  1 trip over a stick at night    Has the patient had a decrease in activity level because of a fear of falling?   No patient feels not difficulty with her balance    Is the patient reluctant to leave their home because of a fear of falling?   No      Home Environment   Living Environment  Private residence      Prior Function   Level of Independence  Independent    Vocation  Full time employment    Vocation Requirements  sitting, walking      Cognition   Overall Cognitive Status  Within Functional Limits for tasks assessed      Observation/Other Assessments   Focus on Therapeutic Outcomes (FOTO)   48% limitation, goal is 37% limitation      Posture/Postural Control   Posture/Postural Control  No significant limitations      ROM / Strength   AROM / PROM / Strength  AROM;PROM;Strength      AROM   Overall AROM   Within functional limits for tasks performed      Strength   Right Hip ABduction  3/5    Left Hip ABduction  4/5      Palpation   SI assessment   right ilium rotated posteriorly; sacrum rotated right    Palpation comment  right lumbar sacral area, bil. SI joint; tenderness located on lateral left hip                Objective measurements completed on examination: See above findings.              PT Education - 08/06/17 1600    Education provided  Yes    Education Details  Access Code: [facility]    Person(s) Educated  Patient    Methods   Explanation;Demonstration;Verbal cues;Handout    Comprehension  Verbalized understanding;Returned demonstration       PT Short Term Goals - 08/06/17 1710      PT SHORT TERM GOAL #1   Title  independent with initial HEP    Time  4    Period  Weeks    Status  New    Target Date  09/03/17        PT Long Term Goals - 08/06/17 1601      PT LONG TERM GOAL #1   Title  independent with HEP and how to progress  herself    Time  8    Period  Weeks    Status  New    Target Date  10/01/17      PT LONG TERM GOAL #2   Title  pain during sleep decreased >/= 75% and able to lay on side    Time  8    Period  Weeks    Status  New    Target Date  10/01/17      PT LONG TERM GOAL #3   Title  understand what stretches to do in the morning to reduce stiffness by 25%    Time  8    Status  New    Target Date  10/01/17      PT LONG TERM GOAL #4   Title  going up and down steps with pain decreased </= 50% due to pelvis in correct alignment and hip abduction strength 4/5    Time  8    Period  Weeks    Status  New    Target Date  10/01/17             Plan - 08/06/17 1600    Clinical Impression Statement  Patient is a 67 year old female with chronic back and right hip with flare-up 2 months ago.  Patient reports her pain level is 5/10 with sleeping, stairs, and morning time is worse.  Patient right ilium rotated posteriorly and  sacrum rotated right.  Lumbar ROM is full.  Bilateral hip abduction is weak.  Tenderness located on lateral right hip, bilateral SI joint, and lumbar paraspinals.  Patient will benefit from skilled therapy to improve core and hip abduction strength to reduce pain and educate her how to manage pain.     History and Personal Factors relevant to plan of care:  None    Clinical Presentation  Stable    Clinical Presentation due to:  stable condition    Clinical Decision Making  Low    Rehab Potential  Excellent    PT Frequency  1x / week    PT Duration  8 weeks     PT Treatment/Interventions  Cryotherapy;Electrical Stimulation;Iontophoresis 4mg /ml Dexamethasone;Moist Heat;Traction;Ultrasound;Neuromuscular re-education;Therapeutic activities;Therapeutic exercise;Patient/family education;Manual techniques;Passive range of motion;Dry needling    PT Next Visit Plan  dry needling to lumbar and gluteus medius; correct sacrum and ilium, soft tissue work, abdominal strength    PT Home Exercise Plan  Access Code: [facility]    Consulted and Agree with Plan of Care  Patient       Patient will benefit from skilled therapeutic intervention in order to improve the following deficits and impairments:  Pain, Decreased strength, Decreased activity tolerance  Visit Diagnosis: Acute bilateral low back pain without sciatica - Plan: PT plan of care cert/re-cert  Muscle weakness (generalized) - Plan: PT plan of care cert/re-cert  Pain in right hip - Plan: PT plan of care cert/re-cert     Problem List Patient Active Problem List   Diagnosis Date Noted  . Emphysema of lung ([place]) 01/05/2015  . Chronic cough 01/23/2012  . Chronic rhinitis 09/29/2008  . Mild intermittent asthma 09/29/2008    Earlie Counts, PT 08/06/17 5:12 PM   [facility] [address] [place], [place], [address] Phone: [phone]   Fax:  [phone]  Name: Michele Reyes MRN: [id number] Date of Birth: 1950/06/06

## 2017-08-06 NOTE — Patient Instructions (Signed)
Access Code: [facility]  URL: [email or web address]  Date: 08/06/2017  Prepared by: Earlie Counts   Exercises  Hooklying Active Hamstring Stretch - 2 reps - 1 sets - 30 hold - 1x daily - 7x weekly  Supine Figure 4 Piriformis Stretch - 2 reps - 1 sets - 30 hold - 1x daily - 7x weekly  Supine Piriformis Stretch with Leg Straight - 2 reps - 1 sets - 30 hold - 1x daily - 7x weekly  Supine Transversus Abdominis Bracing with Double Leg Fallout - 5 reps - 1 sets - 10 hold - 1x daily - 7x weekly  [facility] [address] [place], [place] [address] Phone # [phone] Fax [phone]

## 2017-08-12 ENCOUNTER — Encounter: Payer: Self-pay | Admitting: Physical Therapy

## 2017-08-12 ENCOUNTER — Ambulatory Visit: Payer: PPO | Admitting: Physical Therapy

## 2017-08-12 DIAGNOSIS — M545 Low back pain, unspecified: Secondary | ICD-10-CM

## 2017-08-12 DIAGNOSIS — M25551 Pain in right hip: Secondary | ICD-10-CM

## 2017-08-12 DIAGNOSIS — M6281 Muscle weakness (generalized): Secondary | ICD-10-CM

## 2017-08-12 NOTE — Therapy (Signed)
[facility] [address], [place], [place], [address] Phone: [phone]   Fax:  [phone]  Physical Therapy Treatment  Patient Details  Name: Michele Reyes MRN: [id number] Date of Birth: [birth year] Referring Provider: Bing Matter, PA   Encounter Date: 08/12/2017  PT End of Session - 08/12/17 1230    Visit Number  2    Date for PT Re-Evaluation  10/01/17    PT Start Time  1200 cmae 15 min late    PT Stop Time  1230    PT Time Calculation (min)  30 min    Activity Tolerance  Patient tolerated treatment well    Behavior During Therapy  [facility] for tasks assessed/performed       Past Medical History:  Diagnosis Date  . Asthma   . Headache(784.0)   . Hypertension   . Psoriasis   . Rhinitis     Past Surgical History:  Procedure Laterality Date  . NASAL SINUS SURGERY  2001    There were no vitals filed for this visit.  Subjective Assessment - 08/12/17 1159    Subjective  The exercises are helping.  I still have the pain. I am sleeping better. I am getting up out of bed better.  Sitting is still worse. I am very stiff getting out of the recliner.      Patient Stated Goals  not have to take Ibprufen daily, walk, not have to baby back    Currently in Pain?  Yes    Pain Score  2     Pain Location  Back    Pain Orientation  Lower    Pain Descriptors / Indicators  Aching;Constant;Dull    Pain Type  Chronic pain    Pain Onset  More than a month ago    Pain Frequency  Constant    Aggravating Factors   sleep, laying on side and is a side sleeper, first thing in the mornging    Pain Relieving Factors  Ibrofen, movement helps the stiffness    Multiple Pain Sites  Yes    Pain Score  3    Pain Location  Hip    Pain Orientation  Right    Pain Descriptors / Indicators  Aching;Dull    Pain Type  Chronic pain    Pain Onset  More than a month ago    Pain Frequency  Constant    Aggravating Factors   sitting    Pain Relieving Factors  walking and standing                       [facility] Adult PT Treatment/Exercise - 08/12/17 0001      Manual Therapy   Manual Therapy  Joint mobilization;Muscle Energy Technique    Manual therapy comments  soft tissue work to right ITB, right gluteals; right pifiromis with prickly roller; educated on how to use tennis ball for trigger point massage at home    Joint Mobilization  correct rotated sacrum    Muscle Energy Technique  correct right ilium             PT Education - 08/12/17 1228    Education provided  Yes    Education Details  Access Code: [facility]     Person(s) Educated  Patient    Methods  Explanation;Demonstration;Verbal cues;Handout    Comprehension  Returned demonstration;Verbalized understanding       PT Short Term Goals - 08/12/17 1357  PT SHORT TERM GOAL #1   Title  independent with initial HEP    Time  4    Period  Weeks    Status  Achieved        PT Long Term Goals - 08/06/17 1601      PT LONG TERM GOAL #1   Title  independent with HEP and how to progress  herself    Time  8    Period  Weeks    Status  New    Target Date  10/01/17      PT LONG TERM GOAL #2   Title  pain during sleep decreased >/= 75% and able to lay on side    Time  8    Period  Weeks    Status  New    Target Date  10/01/17      PT LONG TERM GOAL #3   Title  understand what stretches to do in the morning to reduce stiffness by 25%    Time  8    Status  New    Target Date  10/01/17      PT LONG TERM GOAL #4   Title  going up and down steps with pain decreased </= 50% due to pelvis in correct alignment and hip abduction strength 4/5    Time  8    Period  Weeks    Status  New    Target Date  10/01/17            Plan - 08/12/17 1203    Clinical Impression Statement  Patient is feeling better. After manual therapy pelvis was in correct alignment.  Patient was 15 minutes late. Patient had tenderness located in the right  gluteal and SI joint.  Patient just had one visit so she has not met goals yet. Patient will benefit from skilled therapy to improve core and hip abduction strength to reduce pain and educate on how to manage pain.     Rehab Potential  Excellent    Clinical Impairments Affecting Rehab Potential  none    PT Frequency  1x / week    PT Duration  8 weeks    PT Treatment/Interventions  Cryotherapy;Electrical Stimulation;Iontophoresis '4mg'$ /ml Dexamethasone;Moist Heat;Traction;Ultrasound;Neuromuscular re-education;Therapeutic activities;Therapeutic exercise;Patient/family education;Manual techniques;Passive range of motion;Dry needling    PT Next Visit Plan  dry needling to lumbar and gluteus medius;soft tissue work over cloths;  abdominal strength; if MD signs the initial note possible ionto    PT Home Exercise Plan  Access Code: [facility]    Consulted and Agree with Plan of Care  Patient       Patient will benefit from skilled therapeutic intervention in order to improve the following deficits and impairments:  Pain, Decreased strength, Decreased activity tolerance  Visit Diagnosis: Acute bilateral low back pain without sciatica  Muscle weakness (generalized)  Pain in right hip     Problem List Patient Active Problem List   Diagnosis Date Noted  . Emphysema of lung ([place]) 01/05/2015  . Chronic cough 01/23/2012  . Chronic rhinitis 09/29/2008  . Mild intermittent asthma 09/29/2008    Earlie Counts, PT 08/12/17 1:59 PM   [facility] [address], [place], [place], [address] Phone: [phone]   Fax:  [phone]  Name: Michele Reyes MRN: [id number] Date of Birth: 1950/12/06

## 2017-08-12 NOTE — Patient Instructions (Signed)
Access Code: [facility]  URL: [email or web address]  Date: 08/12/2017  Prepared by: Earlie Counts   Exercises  Hooklying Active Hamstring Stretch - 2 reps - 1 sets - 30 hold - 1x daily - 7x weekly  Supine Figure 4 Piriformis Stretch - 2 reps - 1 sets - 30 hold - 1x daily - 7x weekly  Supine Piriformis Stretch with Leg Straight - 2 reps - 1 sets - 30 hold - 1x daily - 7x weekly  Supine Transversus Abdominis Bracing with Double Leg Fallout - 5 reps - 1 sets - 10 hold - 1x daily - 7x weekly  Seated Piriformis Stretch - 1 reps - 1 sets - 30 hold - 1x daily - 7x weekly  Standing Hip Abduction - 10 reps - 3 sets - 1 hold - 1x daily - 7x weekly  Standing Single Leg Hip ER - 10 reps - 1 sets - 1 hold - 1x daily - 7x weekly  [facility] [address] [place], [place] [address] Phone # [phone] Fax [phone]

## 2017-08-20 ENCOUNTER — Encounter: Payer: Self-pay | Admitting: Physical Therapy

## 2017-08-20 ENCOUNTER — Ambulatory Visit: Payer: PPO | Admitting: Physical Therapy

## 2017-08-20 DIAGNOSIS — M545 Low back pain, unspecified: Secondary | ICD-10-CM

## 2017-08-20 DIAGNOSIS — M25551 Pain in right hip: Secondary | ICD-10-CM

## 2017-08-20 DIAGNOSIS — M6281 Muscle weakness (generalized): Secondary | ICD-10-CM

## 2017-08-20 NOTE — Patient Instructions (Signed)
Access Code: [facility]  URL: [email or web address]  Date: 08/20/2017  Prepared by: Earlie Counts   Exercises  Hooklying Active Hamstring Stretch - 2 reps - 1 sets - 30 hold - 1x daily - 7x weekly  Supine Figure 4 Piriformis Stretch - 2 reps - 1 sets - 30 hold - 1x daily - 7x weekly  Supine Piriformis Stretch with Leg Straight - 2 reps - 1 sets - 30 hold - 1x daily - 7x weekly  Supine Transversus Abdominis Bracing with Double Leg Fallout - 5 reps - 1 sets - 10 hold - 1x daily - 7x weekly  Seated Piriformis Stretch - 1 reps - 1 sets - 30 hold - 1x daily - 7x weekly  Standing Hip Abduction - 10 reps - 3 sets - 1 hold - 1x daily - 7x weekly  Standing Single Leg Hip ER - 10 reps - 1 sets - 1 hold - 1x daily - 7x weekly  Beginner Bridge - 15 reps - 1x daily - 7x weekly  Dead Bug - 10 reps - 1 sets - 1x daily - 7x weekly  [facility] [address], [place], [place] [address] Phone # [phone] Fax [phone]

## 2017-08-20 NOTE — Therapy (Signed)
[facility] [address] [place], [place], [address] Phone: [phone]   Fax:  [phone]  Physical Therapy Treatment  Patient Details  Name: Michele Reyes MRN: [id number] Date of Birth: 1950-03-27 Referring Provider: Bing Matter, PA   Encounter Date: 08/20/2017  PT End of Session - 08/20/17 0939    Visit Number  3    Date for PT Re-Evaluation  10/01/17    Authorization Type  [employer]    PT Start Time  0930    PT Stop Time  1010    PT Time Calculation (min)  40 min    Activity Tolerance  Patient tolerated treatment well    Behavior During Therapy  [facility] for tasks assessed/performed       Past Medical History:  Diagnosis Date  . Asthma   . Headache(784.0)   . Hypertension   . Psoriasis   . Rhinitis     Past Surgical History:  Procedure Laterality Date  . NASAL SINUS SURGERY  2001    There were no vitals filed for this visit.  Subjective Assessment - 08/20/17 0942    Subjective  I still feel catches.     Patient Stated Goals  not have to take Ibprufen daily, walk, not have to baby back    Currently in Pain?  Yes    Pain Score  2     Pain Location  Back    Pain Orientation  Lower    Pain Descriptors / Indicators  Dull    Pain Type  Chronic pain    Pain Onset  More than a month ago    Pain Frequency  Intermittent    Aggravating Factors   stairs    Pain Relieving Factors  ibuprofen, movement helps stiffness    Multiple Pain Sites  No         [facility] PT Assessment - 08/20/17 0001      Strength   Right Hip ABduction  3/5    Left Hip ABduction  4/5      Palpation   SI assessment   correct alignment                   [facility] Adult PT Treatment/Exercise - 08/20/17 0001      Lumbar Exercises: Stretches   Active Hamstring Stretch  Right;1 rep;30 seconds    Standing Extension  15 reps    Standing Extension Limitations  leaning on elevated mat with tacile cues to not rotate hip     Piriformis Stretch  2 reps;Right;30 seconds Fiqure 4; pull right leg over to left      Lumbar Exercises: Standing   Other Standing Lumbar Exercises  sit to stand with knees against red band 10x    Other Standing Lumbar Exercises  side stepping with red band around thighs 10 feet 4 times      Lumbar Exercises: Supine   Bridge  10 reps;1 second      Lumbar Exercises: Quadruped   Opposite Arm/Leg Raise  Right arm/Left leg;Left arm/Right leg;5 reps    Opposite Arm/Leg Raise Limitations  needs verbal cues to go slow and brace core             PT Education - 08/20/17 0950    Education provided  Yes    Education Details  use a tennis ball to massage the right piriformis; Access Code: [facility]     Person(s) Educated  Patient    Methods  Explanation;Demonstration    Comprehension  Verbalized understanding;Returned demonstration       PT Short Term Goals - 08/12/17 1357      PT SHORT TERM GOAL #1   Title  independent with initial HEP    Time  4    Period  Weeks    Status  Achieved        PT Long Term Goals - 08/20/17 0940      PT LONG TERM GOAL #1   Title  independent with HEP and how to progress  herself    Time  8    Period  Weeks    Status  On-going      PT LONG TERM GOAL #2   Title  pain during sleep decreased >/= 75% and able to lay on side    Time  8    Period  Weeks    Status  Achieved      PT LONG TERM GOAL #3   Title  understand what stretches to do in the morning to reduce stiffness by 25%    Baseline  80% better    Time  8    Period  Weeks    Status  Achieved      PT LONG TERM GOAL #4   Title  going up and down steps with pain decreased </= 50% due to pelvis in correct alignment and hip abduction strength 4/5    Baseline  depends on when she is doing it and worse when tired, 50% better    Time  8    Period  Weeks    Status  On-going            Plan - 08/20/17 0940    Clinical Impression Statement  Patient is able to lay on her right  side with 90% less pain.  Patient pain with stairs is 50% better. Patient continues to have weakness in the right hip. Pain is now intermittent instead of constant.  Patient is able to walk without a limp. Patient will benefit from skilled therapy to improve core and hi pabduction strength to reduce pain and educate on how to manage pain.     Rehab Potential  Excellent    Clinical Impairments Affecting Rehab Potential  none    PT Frequency  1x / week    PT Duration  8 weeks    PT Treatment/Interventions  Cryotherapy;Electrical Stimulation;Iontophoresis 4mg /ml Dexamethasone;Moist Heat;Traction;Ultrasound;Neuromuscular re-education;Therapeutic activities;Therapeutic exercise;Patient/family education;Manual techniques;Passive range of motion;Dry needling    PT Next Visit Plan  dry needling to lumbar and gluteus medius;soft tissue work over cloths;  abdominal strength; if MD signs the initial note possible ionto    PT Home Exercise Plan  Access Code: [facility]    Consulted and Agree with Plan of Care  Patient       Patient will benefit from skilled therapeutic intervention in order to improve the following deficits and impairments:  Pain, Decreased strength, Decreased activity tolerance  Visit Diagnosis: Acute bilateral low back pain without sciatica  Muscle weakness (generalized)  Pain in right hip     Problem List Patient Active Problem List   Diagnosis Date Noted  . Emphysema of lung ([place]) 01/05/2015  . Chronic cough 01/23/2012  . Chronic rhinitis 09/29/2008  . Mild intermittent asthma 09/29/2008    Michele Reyes, PT 08/20/17 10:12 AM    [facility] [address] [place], [place], [address] Phone: [phone]   Fax:  [phone]  Name: Michele Reyes MRN: [id number] Date of Birth: 02/06/1951

## 2017-08-27 ENCOUNTER — Ambulatory Visit: Payer: PPO | Attending: Family Medicine | Admitting: Physical Therapy

## 2017-08-27 ENCOUNTER — Encounter: Payer: Self-pay | Admitting: Physical Therapy

## 2017-08-27 DIAGNOSIS — M545 Low back pain, unspecified: Secondary | ICD-10-CM

## 2017-08-27 DIAGNOSIS — M6281 Muscle weakness (generalized): Secondary | ICD-10-CM | POA: Insufficient documentation

## 2017-08-27 DIAGNOSIS — M25551 Pain in right hip: Secondary | ICD-10-CM | POA: Insufficient documentation

## 2017-08-27 NOTE — Patient Instructions (Addendum)
Access Code: [email or web address]  URL: [email or web address]  Date: 08/27/2017  Prepared by: Earlie Counts   Exercises  Full Leg Press - 10 reps - 3 sets  Hamstring Curl with Weight Machine - 10 reps - 3 sets - 1x daily - 7x weekly  Knee Extension with Weight Machine - 10 reps - 3 sets - 1x daily - 7x weekly  Sidelying Pelvic Floor Contraction with Hip Abduction - 10 reps - 1 sets - 1x daily - 7x weekly  Lat bar Chest press Row nustep  [facility] [address], [place], [place] [address] Phone # [phone] Fax [phone]

## 2017-08-27 NOTE — Therapy (Signed)
[facility] [address], [place], [place], [address] Phone: [phone]   Fax:  [phone]  Physical Therapy Treatment  Patient Details  Name: Michele Reyes MRN: [id number] Date of Birth: 1950/04/05 Referring Provider: Legrand Rams, PA   Encounter Date: 08/27/2017  PT End of Session - 08/27/17 0852    Visit Number  4    Date for PT Re-Evaluation  10/01/17    Authorization Type  [employer]    PT Start Time  0800    PT Stop Time  0849    PT Time Calculation (min)  49 min    Activity Tolerance  Patient tolerated treatment well    Behavior During Therapy  [facility] for tasks assessed/performed       Past Medical History:  Diagnosis Date  . Asthma   . Headache(784.0)   . Hypertension   . Psoriasis   . Rhinitis     Past Surgical History:  Procedure Laterality Date  . NASAL SINUS SURGERY  2001    There were no vitals filed for this visit.  Subjective Assessment - 08/27/17 0806    Subjective  I am stiff today.  This appointment is early for me.  I do better with the exericses. They loosen me up. Stiffness.    Patient Stated Goals  not have to take Ibprufen daily, walk, not have to baby back    Currently in Pain?  No/denies         [facility] PT Assessment - 08/27/17 0001      Assessment   Medical Diagnosis  M54.5, 724.2, 338.29 chronic midline low back pain without sciatica; [id number] right hip pain    Referring Provider  Legrand Rams, PA    Onset Date/Surgical Date  03/24/17    Prior Therapy  chiropractor      Precautions   Precautions  None      Restrictions   Weight Bearing Restrictions  No      Home Environment   Living Environment  Private residence      Prior Function   Level of Independence  Independent    Vocation  Full time employment    Vocation Requirements  sitting, walking      Cognition   Overall Cognitive Status  Within Functional Limits for tasks assessed       Observation/Other Assessments   Focus on Therapeutic Outcomes (FOTO)   35% limitation      Posture/Postural Control   Posture/Postural Control  No significant limitations      AROM   Overall AROM   Within functional limits for tasks performed      Strength   Right Hip ABduction  3+/5    Left Hip ABduction  4/5      Palpation   SI assessment   correct alignment                   [facility] Adult PT Treatment/Exercise - 08/27/17 0001      Lumbar Exercises: Stretches   Active Hamstring Stretch  Right;1 rep;30 seconds    Quad Stretch  Right;Left;1 rep;30 seconds doorway stretch    Piriformis Stretch  2 reps;Right;30 seconds Fiqure 4; pull right leg over to left      Knee/Hip Exercises: Machines for Strengthening   Cybex Knee Extension  5# 3x10    Cybex Knee Flexion  25# 3x10    Cybex Leg Press  55# 3x10    Other Machine  lat bar 25#  3x10; vertical chest press 25# 3x10; lat bar 25# 3x10; row machine 25# 3x10             PT Education - 08/27/17 0850    Education provided  Yes    Education Details  gym program    Person(s) Educated  Patient    Methods  Explanation;Demonstration;Verbal cues;Handout    Comprehension  Returned demonstration;Verbalized understanding       PT Short Term Goals - 08/12/17 1357      PT SHORT TERM GOAL #1   Title  independent with initial HEP    Time  4    Period  Weeks    Status  Achieved        PT Long Term Goals - 08/27/17 4431      PT LONG TERM GOAL #1   Title  independent with HEP and how to progress  herself    Time  8    Period  Weeks    Status  Achieved      PT LONG TERM GOAL #2   Title  pain during sleep decreased >/= 75% and able to lay on side    Time  8    Period  Weeks    Status  Achieved      PT LONG TERM GOAL #3   Title  understand what stretches to do in the morning to reduce stiffness by 25%    Time  8    Period  Weeks    Status  Achieved      PT LONG TERM GOAL #4   Title  going up and down steps  with pain decreased </= 50% due to pelvis in correct alignment and hip abduction strength 4/5    Time  8    Period  Weeks    Status  Achieved            Plan - 08/27/17 5400    Clinical Impression Statement  Patient continues to have weakness in bilateral hip abductors. Patient pain is 80% better. Patient is able to sleep on her side now.  Patient is able to walk without a limp.  Patient is independent with HEP.     Rehab Potential  Excellent    Clinical Impairments Affecting Rehab Potential  none    PT Frequency  1x / week    PT Duration  8 weeks    PT Treatment/Interventions  Cryotherapy;Electrical Stimulation;Iontophoresis '4mg'$ /ml Dexamethasone;Moist Heat;Traction;Ultrasound;Neuromuscular re-education;Therapeutic activities;Therapeutic exercise;Patient/family education;Manual techniques;Passive range of motion;Dry needling    PT Next Visit Plan  Discharge to HEP    PT Home Exercise Plan  Access Code: [email or web address]    Recommended Other Services  sent second notice to sign initial eval on 08/27/2017    Consulted and Agree with Plan of Care  Patient       Patient will benefit from skilled therapeutic intervention in order to improve the following deficits and impairments:  Pain, Decreased strength, Decreased activity tolerance  Visit Diagnosis: Acute bilateral low back pain without sciatica  Muscle weakness (generalized)  Pain in right hip     Problem List Patient Active Problem List   Diagnosis Date Noted  . Emphysema of lung ([place]) 01/05/2015  . Chronic cough 01/23/2012  . Chronic rhinitis 09/29/2008  . Mild intermittent asthma 09/29/2008    Earlie Counts, PT 08/27/17 9:03 AM   [facility] [address], [place], [place], [address] Phone: [phone]   Fax:  [phone]  Name: Michele Reyes MRN: [id number] Date of Birth: 1950-04-10 PHYSICAL THERAPY DISCHARGE SUMMARY  Visits from Start of Care:  4  Current functional level related to goals / functional outcomes: See above.   Remaining deficits: See above.    Education / Equipment: HEP Plan: Patient agrees to discharge.  Patient goals were met. Patient is being discharged due to meeting the stated rehab goals.  Thank you for the referral. Earlie Counts, PT 08/27/17 9:03 AM  ?????

## 2017-09-03 ENCOUNTER — Encounter: Payer: Self-pay | Admitting: Physical Therapy

## 2018-01-28 DIAGNOSIS — J4521 Mild intermittent asthma with (acute) exacerbation: Secondary | ICD-10-CM | POA: Diagnosis not present

## 2018-02-16 DIAGNOSIS — J101 Influenza due to other identified influenza virus with other respiratory manifestations: Secondary | ICD-10-CM | POA: Diagnosis not present

## 2018-04-13 DIAGNOSIS — H35033 Hypertensive retinopathy, bilateral: Secondary | ICD-10-CM | POA: Diagnosis not present

## 2018-04-13 DIAGNOSIS — H524 Presbyopia: Secondary | ICD-10-CM | POA: Diagnosis not present

## 2018-04-20 DIAGNOSIS — J0100 Acute maxillary sinusitis, unspecified: Secondary | ICD-10-CM | POA: Diagnosis not present

## 2018-07-29 DIAGNOSIS — Z20828 Contact with and (suspected) exposure to other viral communicable diseases: Secondary | ICD-10-CM | POA: Diagnosis not present

## 2019-03-07 DIAGNOSIS — K219 Gastro-esophageal reflux disease without esophagitis: Secondary | ICD-10-CM | POA: Diagnosis not present

## 2019-03-07 DIAGNOSIS — Z1231 Encounter for screening mammogram for malignant neoplasm of breast: Secondary | ICD-10-CM | POA: Diagnosis not present

## 2019-03-07 DIAGNOSIS — J4520 Mild intermittent asthma, uncomplicated: Secondary | ICD-10-CM | POA: Diagnosis not present

## 2019-03-07 DIAGNOSIS — Z01419 Encounter for gynecological examination (general) (routine) without abnormal findings: Secondary | ICD-10-CM | POA: Diagnosis not present

## 2019-03-07 DIAGNOSIS — Z0000 Encounter for general adult medical examination without abnormal findings: Secondary | ICD-10-CM | POA: Diagnosis not present

## 2019-03-07 DIAGNOSIS — I10 Essential (primary) hypertension: Secondary | ICD-10-CM | POA: Diagnosis not present

## 2019-03-07 DIAGNOSIS — R635 Abnormal weight gain: Secondary | ICD-10-CM | POA: Diagnosis not present

## 2019-03-10 DIAGNOSIS — Z79899 Other long term (current) drug therapy: Secondary | ICD-10-CM | POA: Diagnosis not present

## 2019-03-10 DIAGNOSIS — R635 Abnormal weight gain: Secondary | ICD-10-CM | POA: Diagnosis not present

## 2019-03-10 DIAGNOSIS — I10 Essential (primary) hypertension: Secondary | ICD-10-CM | POA: Diagnosis not present

## 2019-04-06 DIAGNOSIS — K219 Gastro-esophageal reflux disease without esophagitis: Secondary | ICD-10-CM | POA: Diagnosis not present

## 2019-04-06 DIAGNOSIS — E785 Hyperlipidemia, unspecified: Secondary | ICD-10-CM | POA: Diagnosis not present

## 2019-04-06 DIAGNOSIS — F329 Major depressive disorder, single episode, unspecified: Secondary | ICD-10-CM | POA: Diagnosis not present

## 2019-06-27 DIAGNOSIS — H35033 Hypertensive retinopathy, bilateral: Secondary | ICD-10-CM | POA: Diagnosis not present

## 2019-06-28 ENCOUNTER — Emergency Department (HOSPITAL_COMMUNITY)
Admission: EM | Admit: 2019-06-28 | Discharge: 2019-06-28 | Disposition: A | Discharge status: Home / Self Care | Payer: PPO | Attending: Emergency Medicine | Admitting: Emergency Medicine

## 2019-06-28 ENCOUNTER — Emergency Department (HOSPITAL_COMMUNITY): Payer: PPO

## 2019-06-28 ENCOUNTER — Other Ambulatory Visit: Payer: Self-pay

## 2019-06-28 DIAGNOSIS — R079 Chest pain, unspecified: Secondary | ICD-10-CM | POA: Diagnosis not present

## 2019-06-28 DIAGNOSIS — I10 Essential (primary) hypertension: Secondary | ICD-10-CM | POA: Diagnosis not present

## 2019-06-28 DIAGNOSIS — Z87891 Personal history of nicotine dependence: Secondary | ICD-10-CM | POA: Insufficient documentation

## 2019-06-28 DIAGNOSIS — J441 Chronic obstructive pulmonary disease with (acute) exacerbation: Secondary | ICD-10-CM | POA: Diagnosis not present

## 2019-06-28 DIAGNOSIS — Z79899 Other long term (current) drug therapy: Secondary | ICD-10-CM | POA: Diagnosis not present

## 2019-06-28 LAB — BASIC METABOLIC PANEL
Anion gap: 9 (ref 5–15)
BUN: 26 mg/dL — ABNORMAL HIGH (ref 8–23)
CO2: 24 mmol/L (ref 22–32)
Calcium: 9.3 mg/dL (ref 8.9–10.3)
Chloride: 106 mmol/L (ref 98–111)
Creatinine, Ser: 0.85 mg/dL (ref 0.44–1.00)
GFR calc Af Amer: >60 mL/min (ref >60)
GFR calc non Af Amer: >60 mL/min (ref >60)
Glucose, Bld: 93 mg/dL (ref 70–99)
Potassium: 4.4 mmol/L (ref 3.5–5.1)
Sodium: 139 mmol/L (ref 135–145)

## 2019-06-28 LAB — CBC
HCT: 44.3 % (ref 36.0–46.0)
Hemoglobin: 14.3 g/dL (ref 12.0–15.0)
MCH: 29.6 pg (ref 26.0–34.0)
MCHC: 32.3 g/dL (ref 30.0–36.0)
MCV: 91.7 fL (ref 80.0–100.0)
Platelets: 324 10*3/uL (ref 150–400)
RBC: 4.83 MIL/uL (ref 3.87–5.11)
RDW: 13.6 % (ref 11.5–15.5)
WBC: 6.5 10*3/uL (ref 4.0–10.5)
nRBC: 0 % (ref 0.0–0.2)

## 2019-06-28 LAB — TROPONIN I (HIGH SENSITIVITY)
Troponin I (High Sensitivity): 5 ng/L (ref <18)
Troponin I (High Sensitivity): 4 ng/L (ref <18)

## 2019-06-28 MED ORDER — AZITHROMYCIN 250 MG PO TABS: 250.0000 mg | ORAL_TABLET | Freq: Every day | ORAL | 0 refills | Status: DC

## 2019-06-28 MED ORDER — SODIUM CHLORIDE 0.9% FLUSH: 3.0000 mL | Freq: Once | INTRAVENOUS | Status: DC

## 2019-06-28 NOTE — ED Provider Notes (Signed)
[facility] EMERGENCY DEPARTMENT Provider Note   CSN: [id number] Arrival date & time: 06/28/19  1107     History Chief Complaint  Patient presents with  . Chest Pain    Michele Reyes is a 69 y.o. female with a past medical history of COPD, hypertension, hyperlipidemia presenting to the ED with a chief complaint of chest pain.  For the past 2 nights has been having sharp chest pain radiating to her neck when she is "scrunched up" and laying on her side while trying to sleep.  When she repositions herself, her chest pain resolves.  She denies any chest pain with exertion.  She told her PCP about this and was told that it could be due to reflux.  She was told to increase her dose of Pepcid but she was unable to do so.  She does report some cough with worsening mucus than her baseline.  Reports shortness of breath as well.  She has been using her home inhalers with some improvement in the symptoms.  She denies any hemoptysis, leg swelling, history of DVT, PE, MI, recent immobilization, fever, sick contacts with similar symptoms, vomiting or abdominal pain.  HPI     Past Medical History:  Diagnosis Date  . Asthma   . Headache(784.0)   . Hypertension   . Psoriasis   . Rhinitis     Patient Active Problem List   Diagnosis Date Noted  . Emphysema of lung ([place]) 01/05/2015  . Chronic cough 01/23/2012  . Chronic rhinitis 09/29/2008  . Mild intermittent asthma 09/29/2008    Past Surgical History:  Procedure Laterality Date  . NASAL SINUS SURGERY  2001     OB History   No obstetric history on file.     Family History  Problem Relation Age of Onset  . Allergies Brother        x2  . Allergies Sister   . Hypertension Mother   . Heart attack Mother   . Colon cancer Mother   . Hypertension Father   . Heart attack Father   . Heart failure Father   . Breast cancer Sister     Social History   Tobacco Use  . Smoking status: Former Smoker    Packs/day:  2.00    Years: 10.00    Pack years: 20.00    Types: Cigarettes    Quit date: 03/24/1993    Years since quitting: 26.2  . Smokeless tobacco: Never Used  Substance Use Topics  . Alcohol use: Yes    Comment: occasionally  . Drug use: No    Home Medications Prior to Admission medications   Medication Sig Start Date End Date Taking? Authorizing Provider  albuterol (PROAIR HFA) 108 (90 BASE) MCG/ACT inhaler Inhale 2 puffs into the lungs every 6 (six) hours as needed for wheezing. 03/06/15   Chesley Mires, MD  azithromycin (ZITHROMAX) 250 MG tablet Take 1 tablet (250 mg total) by mouth daily. Take first 2 tablets together, then 1 every day until finished. 06/28/19   Adya Wirz, PA-C  budesonide-formoterol (SYMBICORT) 80-4.5 MCG/ACT inhaler Inhale 2 puffs into the lungs 2 (two) times daily. 04/21/17   Parrett, Fonnie Mu, NP  Cholecalciferol (VITAMIN D3) 5000 UNITS CAPS Take 5,000 Units by mouth daily.     [provider]  ibuprofen (ADVIL,MOTRIN) 200 MG tablet Take 200 mg by mouth every 6 (six) hours as needed for pain.    [provider]  losartan-hydrochlorothiazide (HYZAAR) 50-12.5 MG per tablet  Take 1 tablet by mouth daily.    [provider]  medium chain triglycerides (MCT OIL) oil Take 15 mLs by mouth daily.    [provider]  mometasone-formoterol (DULERA) 100-5 MCG/ACT AERO Inhale 2 puffs into the lungs 2 (two) times daily. Patient not taking: Reported on 08/06/2017 01/06/17   Parrett, Fonnie Mu, NP  NON FORMULARY Nature's Own shake powder - veggies and supplements    [provider]  Spacer/Aero-Holding Chambers (AEROCHAMBER MV) inhaler Use as instructed 01/14/13   Chesley Mires, MD  TURMERIC PO Take by mouth daily. Mixed in with shake    [provider]    Allergies    Levaquin [levofloxacin], Penicillins, and Sulfa antibiotics  Review of Systems   Review of Systems  Constitutional: Negative for appetite change, chills and fever.    HENT: Negative for ear pain, rhinorrhea, sneezing and sore throat.   Eyes: Negative for photophobia and visual disturbance.  Respiratory: Positive for cough and shortness of breath. Negative for chest tightness and wheezing.   Cardiovascular: Positive for chest pain. Negative for palpitations.  Gastrointestinal: Negative for abdominal pain, blood in stool, constipation, diarrhea, nausea and vomiting.  Genitourinary: Negative for dysuria, hematuria and urgency.  Musculoskeletal: Negative for myalgias.  Skin: Negative for rash.  Neurological: Negative for dizziness, weakness and light-headedness.    Physical Exam Updated Vital Signs BP (!) 144/103   Pulse 87   Temp (!) 97.4 F (36.3 C) (Oral)   Resp 17   SpO2 98%   Physical Exam Vitals and nursing note reviewed.  Constitutional:      General: She is not in acute distress.    Appearance: She is well-developed.     Comments: Speaking in complete sentences without difficulty.  HENT:     Head: Normocephalic and atraumatic.     Nose: Nose normal.  Eyes:     General: No scleral icterus.       Left eye: No discharge.     Conjunctiva/sclera: Conjunctivae normal.  Cardiovascular:     Rate and Rhythm: Normal rate and regular rhythm.     Heart sounds: Normal heart sounds. No murmur. No friction rub. No gallop.   Pulmonary:     Effort: Pulmonary effort is normal. No respiratory distress.     Breath sounds: Normal breath sounds.  Abdominal:     General: Bowel sounds are normal. There is no distension.     Palpations: Abdomen is soft.     Tenderness: There is no abdominal tenderness. There is no guarding.  Musculoskeletal:        General: Normal range of motion.     Cervical back: Normal range of motion and neck supple.  Skin:    General: Skin is warm and dry.     Findings: No rash.  Neurological:     Mental Status: She is alert.     Motor: No abnormal muscle tone.     Coordination: Coordination normal.     ED Results /  Procedures / Treatments   Labs (all labs ordered are listed, but only abnormal results are displayed) Labs Reviewed  BASIC METABOLIC PANEL - Abnormal; Notable for the following components:      Result Value   BUN 26 (*)    All other components within normal limits  CBC  TROPONIN I (HIGH SENSITIVITY)  TROPONIN I (HIGH SENSITIVITY)    EKG EKG Interpretation  Date/Time:  Tuesday June 28 2019 11:12:43 EDT Ventricular Rate:  90 PR Interval:  140 QRS Duration: 76 QT Interval:  338 QTC Calculation: 413 R Axis:   10 Text Interpretation: Normal sinus rhythm Low voltage QRS Borderline ECG No old tracing to compare Confirmed by Pattricia Boss [phone]) on 06/28/2019 12:52:30 PM   Radiology DG Chest 2 View  Result Date: 06/28/2019 CLINICAL DATA:  Chest pain EXAM: CHEST - 2 VIEW COMPARISON:  12/08/2016 FINDINGS: Cardiac shadow is within normal limits. The lungs are well aerated bilaterally. Aortic calcifications are again seen and stable. Mild hyperinflation is noted with left basilar scarring. IMPRESSION: Left basilar scarring and COPD.  No acute abnormality is noted. Electronically Signed   By: Inez Catalina M.D.   On: 06/28/2019 11:41    Procedures Procedures (including critical care time)  Medications Ordered in ED Medications  sodium chloride flush (NS) 0.9 % injection 3 mL (has no administration in time range)    ED Course  I have reviewed the triage vital signs and the nursing notes.  Pertinent labs & imaging results that were available during my care of the patient were reviewed by me and considered in my medical decision making (see chart for details).    MDM Rules/Calculators/A&P                      69 year old female with a past medical history of COPD, hypertension presenting to the ED with a chief complaint of chest pain.  Reports generalized chest pain radiating to her neck when she is "scrunched up" in bed while trying to sleep on her side.  When she repositions and  stretches herself the chest pain resolves.  Denies any chest pain with exertion.  PCP told her that it could be due to reflux.  On my exam patient speaking complete sentences without difficulty.  She does endorse some shortness of breath and cough with change in her mucus than baseline.  She is afebrile here.  She denies any lower extremity edema, erythema or calf tenderness.  Low suspicion for DVT on my exam.  EKG shows normal sinus rhythm.  Chest x-ray shows findings consistent with COPD without any acute findings.  CBC, BMP, initial delta troponin are both negative.  At this time I feel that with her reassuring work-up I doubt ACS as a cause of her symptoms.  She is low risk by Wells criteria for PE so I doubt that this is the cause of her symptoms.  I feel she will benefit from PCP and possibly cardiology follow-up.  At this time I think her symptoms are most likely due to a COPD exacerbation with her unusual chest pain, shortness of breath.  She states that Z-Pak has helped her in the past.  We will prescribe this for her as well as continuing her home medications.  Return precautions given.  Patient is hemodynamically stable, in NAD, and able to ambulate in the ED. Evaluation does not show pathology that would require ongoing emergent intervention or inpatient treatment. I have personally reviewed and interpreted all lab work and imaging at today's ED visit. I explained the diagnosis to the patient. Pain has been managed and has no complaints prior to discharge. Patient is comfortable with above plan and is stable for discharge at this time. All questions were answered prior to disposition. Strict return precautions for returning to the ED were discussed. Encouraged follow up with PCP.   An After Visit Summary was printed and given to the patient.   Portions of this note were generated with Dragon  dictation software. Dictation errors may occur despite best attempts at proofreading.  Final Clinical  Impression(s) / ED Diagnoses Final diagnoses:  COPD with acute exacerbation [facility])    Rx / DC Orders ED Discharge Orders         Ordered    azithromycin (ZITHROMAX) 250 MG tablet  Daily     06/28/19 1926           Delia Heady, PA-C 06/28/19 1930    Isla Pence, MD 06/28/19 2048

## 2019-06-28 NOTE — ED Triage Notes (Signed)
Pt here for evaluation of two nights of chest pain when lying on her side in bed. Pt sts whenever she is not in that position her chest does not hurt. Hx of emphysema and COPD, sts her breathing has been worse over the last year.

## 2019-06-28 NOTE — Discharge Instructions (Signed)
Take the antibiotics as prescribed. Return to the ER if you start to experience worsening chest pain, shortness of breath, numbness in arms or legs or leg swelling.

## 2019-08-17 DIAGNOSIS — M9903 Segmental and somatic dysfunction of lumbar region: Secondary | ICD-10-CM | POA: Diagnosis not present

## 2019-08-17 DIAGNOSIS — M5136 Other intervertebral disc degeneration, lumbar region: Secondary | ICD-10-CM | POA: Diagnosis not present

## 2019-08-17 DIAGNOSIS — M50322 Other cervical disc degeneration at C5-C6 level: Secondary | ICD-10-CM | POA: Diagnosis not present

## 2019-08-17 DIAGNOSIS — M9901 Segmental and somatic dysfunction of cervical region: Secondary | ICD-10-CM | POA: Diagnosis not present

## 2019-08-24 DIAGNOSIS — M9901 Segmental and somatic dysfunction of cervical region: Secondary | ICD-10-CM | POA: Diagnosis not present

## 2019-08-24 DIAGNOSIS — M5136 Other intervertebral disc degeneration, lumbar region: Secondary | ICD-10-CM | POA: Diagnosis not present

## 2019-08-24 DIAGNOSIS — M9903 Segmental and somatic dysfunction of lumbar region: Secondary | ICD-10-CM | POA: Diagnosis not present

## 2019-08-24 DIAGNOSIS — M50322 Other cervical disc degeneration at C5-C6 level: Secondary | ICD-10-CM | POA: Diagnosis not present

## 2019-08-30 DIAGNOSIS — I10 Essential (primary) hypertension: Secondary | ICD-10-CM | POA: Diagnosis not present

## 2019-08-30 DIAGNOSIS — J439 Emphysema, unspecified: Secondary | ICD-10-CM | POA: Diagnosis not present

## 2019-08-30 DIAGNOSIS — F329 Major depressive disorder, single episode, unspecified: Secondary | ICD-10-CM | POA: Diagnosis not present

## 2019-08-30 DIAGNOSIS — E785 Hyperlipidemia, unspecified: Secondary | ICD-10-CM | POA: Diagnosis not present

## 2019-08-30 DIAGNOSIS — K219 Gastro-esophageal reflux disease without esophagitis: Secondary | ICD-10-CM | POA: Diagnosis not present

## 2019-08-31 DIAGNOSIS — M9903 Segmental and somatic dysfunction of lumbar region: Secondary | ICD-10-CM | POA: Diagnosis not present

## 2019-08-31 DIAGNOSIS — M5136 Other intervertebral disc degeneration, lumbar region: Secondary | ICD-10-CM | POA: Diagnosis not present

## 2019-08-31 DIAGNOSIS — M50322 Other cervical disc degeneration at C5-C6 level: Secondary | ICD-10-CM | POA: Diagnosis not present

## 2019-08-31 DIAGNOSIS — M9901 Segmental and somatic dysfunction of cervical region: Secondary | ICD-10-CM | POA: Diagnosis not present

## 2019-09-19 DIAGNOSIS — L820 Inflamed seborrheic keratosis: Secondary | ICD-10-CM | POA: Diagnosis not present

## 2019-09-19 DIAGNOSIS — D485 Neoplasm of uncertain behavior of skin: Secondary | ICD-10-CM | POA: Diagnosis not present

## 2019-09-19 DIAGNOSIS — L570 Actinic keratosis: Secondary | ICD-10-CM | POA: Diagnosis not present

## 2019-09-19 DIAGNOSIS — L821 Other seborrheic keratosis: Secondary | ICD-10-CM | POA: Diagnosis not present

## 2019-10-29 DIAGNOSIS — Z03818 Encounter for observation for suspected exposure to other biological agents ruled out: Secondary | ICD-10-CM | POA: Diagnosis not present

## 2019-10-29 DIAGNOSIS — Z20822 Contact with and (suspected) exposure to covid-19: Secondary | ICD-10-CM | POA: Diagnosis not present

## 2019-11-08 NOTE — Progress Notes (Signed)
@Patient  ID: Michele Reyes, female    DOB: 1950-07-02, 69 y.o.   MRN: [id number]  Chief Complaint  Patient presents with  . Acute Visit    SOB getting worse over last year    Referring provider: Josefa Half*  HPI: 69 year old female, former smoker quit in 1995. PMH significant for mild intermittent asthma, emphysema of lung, chronic rhinitis, chronic cough. Patient of Dr. Halford Chessman, last seen by pulmonary NP on 01/06/17. Dulera changed to Symbicort 80 in October 2018.   11/09/2019 Patient presents today for acute visit for shortness of breath. She has not been seen in our office in close to 3 years. Over the last year with covid and having to wear mask she feels her breathing has worsened. She reports having shortness of breath daily. She reports intermittent wheezing during the day and at night, chest tightness and cough. She is compliant with Symbicort two puffs twice daily. She uses Albuterol rescue inhaler several times a week. Her weight has been stable over the last 2 years.    Tests Spirometry 09/29/08 >> FEV1 2.55(100%), FEV1% 72 11/16/08 IgE 45 CT sinus 01/24/10>>b/l maxillary sinusitis CXR 01/23/12>>hyperinflation, peribronchial thickening PFT 02/25/12>>FEV1 2.41 (105%), FEV1% 63, TLC 5.28 (108%), DLCO 75%, no BD Spirometry 08/10/12 >> FEV1 1.95 (79%), FEV1% 69 CT chest 12/27/14 >> mild emphysema, ATX RLL PFT 01/02/15 >> FEV1 2.43 (97%), FEV1% 74, TLC 5.02 (99%), DLCO 81%, no BD  Allergies  Allergen Reactions  . Levaquin [Levofloxacin] Hives  . Penicillins     REACTION: hives  . Sulfa Antibiotics Hives    Immunization History  Administered Date(s) Administered  . Influenza, High Dose Seasonal PF 02/09/2019  . Influenza-Unspecified 12/18/2010, 02/10/2019  . Moderna SARS-COVID-2 Vaccination 04/27/2019, 05/25/2019  . Pneumococcal Conjugate-13 08/03/2017, 02/09/2019  . Tdap 03/25/2008    Past Medical History:  Diagnosis Date  . Asthma   .  Headache(784.0)   . Hypertension   . Psoriasis   . Rhinitis     Tobacco History: Social History   Tobacco Use  Smoking Status Former Smoker  . Packs/day: 2.00  . Years: 10.00  . Pack years: 20.00  . Types: Cigarettes  . Quit date: 03/24/1993  . Years since quitting: 26.6  Smokeless Tobacco Never Used   Counseling given: Not Answered   Outpatient Medications Prior to Visit  Medication Sig Dispense Refill  . albuterol (PROAIR HFA) 108 (90 BASE) MCG/ACT inhaler Inhale 2 puffs into the lungs every 6 (six) hours as needed for wheezing. 1 Inhaler 2  . budesonide-formoterol (SYMBICORT) 80-4.5 MCG/ACT inhaler Inhale 2 puffs into the lungs 2 (two) times daily. 3 Inhaler 0  . Cholecalciferol (VITAMIN D3) 5000 UNITS CAPS Take 5,000 Units by mouth daily.     . DULoxetine (CYMBALTA) 30 MG capsule Take 30 mg by mouth daily.    Marland Kitchen ibuprofen (ADVIL,MOTRIN) 200 MG tablet Take 200 mg by mouth every 6 (six) hours as needed for pain.    Marland Kitchen losartan-hydrochlorothiazide (HYZAAR) 50-12.5 MG per tablet Take 1 tablet by mouth daily.    . medium chain triglycerides (MCT OIL) oil Take 15 mLs by mouth daily.    . mometasone-formoterol (DULERA) 100-5 MCG/ACT AERO Inhale 2 puffs into the lungs 2 (two) times daily. 1 Inhaler 0  . NON FORMULARY Nature's Own shake powder - veggies and supplements    . omeprazole (PRILOSEC) 40 MG capsule Take 40 mg by mouth daily.    Marland Kitchen Spacer/Aero-Holding Chambers (AEROCHAMBER MV) inhaler Use as instructed 1  each 0  . TURMERIC PO Take by mouth daily. Mixed in with shake    . azithromycin (ZITHROMAX) 250 MG tablet Take 1 tablet (250 mg total) by mouth daily. Take first 2 tablets together, then 1 every day until finished. 6 tablet 0   No facility-administered medications prior to visit.    Review of Systems  Review of Systems  Constitutional: Negative.   Respiratory: Positive for cough, chest tightness, shortness of breath and wheezing.    Physical Exam  BP 132/70 (BP  Location: Left Arm, Cuff Size: Large)   Pulse 80   Temp 97.6 F (36.4 C) (Temporal)   Ht 5\' 4"  (1.626 m)   Wt 237 lb 9.6 oz (107.8 kg)   SpO2 93%   BMI 40.78 kg/m  Physical Exam Constitutional:      Appearance: Normal appearance.  HENT:     Head: Normocephalic and atraumatic.     Mouth/Throat:     Mouth: Mucous membranes are moist.     Pharynx: Oropharynx is clear.  Cardiovascular:     Rate and Rhythm: Normal rate and regular rhythm.  Pulmonary:     Effort: Pulmonary effort is normal.     Breath sounds: Normal breath sounds.     Comments: Congested cough Skin:    General: Skin is warm and dry.  Neurological:     General: No focal deficit present.     Mental Status: She is alert and oriented to person, place, and time. Mental status is at baseline.  Psychiatric:        Mood and Affect: Mood normal.        Behavior: Behavior normal.        Thought Content: Thought content normal.        Judgment: Judgment normal.      Lab Results:  CBC    Component Value Date/Time   WBC 6.5 06/28/2019 1130   RBC 4.83 06/28/2019 1130   HGB 14.3 06/28/2019 1130   HCT 44.3 06/28/2019 1130   PLT 324 06/28/2019 1130   MCV 91.7 06/28/2019 1130   MCH 29.6 06/28/2019 1130   MCHC 32.3 06/28/2019 1130   RDW 13.6 06/28/2019 1130   LYMPHSABS 2.5 12/16/2016 0823   MONOABS 0.6 12/16/2016 0823   EOSABS 0.1 12/16/2016 0823   BASOSABS 0.1 12/16/2016 0823    BMET    Component Value Date/Time   NA 139 06/28/2019 1130   K 4.4 06/28/2019 1130   CL 106 06/28/2019 1130   CO2 24 06/28/2019 1130   GLUCOSE 93 06/28/2019 1130   BUN 26 (H) 06/28/2019 1130   CREATININE 0.85 06/28/2019 1130   CALCIUM 9.3 06/28/2019 1130   GFRNONAA >60 06/28/2019 1130   GFRAA >60 06/28/2019 1130    BNP No results found for: BNP  ProBNP No results found for: PROBNP  Imaging: No results found.   Assessment & Plan:   Mild intermittent asthma - Worsening shortness of breath over the last year.  Associated wheezing, chest tightness and cough. She is a former smoker and has emphysema on imaging. - Continue Symbicort 80 two puffs twice daily - RX prednisone taper for acute asthmatic bronchitis - Add Xyzal once daily over the counter  - Checking CXR today  - Follow up in 3 months with PFTs (may need maintenance inhaler adjustment)   Martyn Ehrich, NP 11/09/2019

## 2019-11-09 ENCOUNTER — Other Ambulatory Visit: Payer: Self-pay

## 2019-11-09 ENCOUNTER — Encounter: Payer: Self-pay | Admitting: Primary Care

## 2019-11-09 ENCOUNTER — Ambulatory Visit (INDEPENDENT_AMBULATORY_CARE_PROVIDER_SITE_OTHER): Payer: PPO

## 2019-11-09 ENCOUNTER — Ambulatory Visit (INDEPENDENT_AMBULATORY_CARE_PROVIDER_SITE_OTHER): Payer: PPO | Admitting: Primary Care

## 2019-11-09 VITALS — BP 132/70 | HR 80 | Temp 97.6°F | Ht 64.0 in | Wt 237.6 lb

## 2019-11-09 DIAGNOSIS — J45909 Unspecified asthma, uncomplicated: Secondary | ICD-10-CM

## 2019-11-09 DIAGNOSIS — J4521 Mild intermittent asthma with (acute) exacerbation: Secondary | ICD-10-CM | POA: Diagnosis not present

## 2019-11-09 MED ORDER — PREDNISONE 10 MG PO TABS: ORAL_TABLET | ORAL | 0 refills | Status: DC

## 2019-11-09 NOTE — Assessment & Plan Note (Addendum)
-   Worsening shortness of breath over the last year. Associated wheezing, chest tightness and cough. She is a former smoker and has emphysema on imaging. - Continue Symbicort 80 two puffs twice daily - RX prednisone taper for acute asthmatic bronchitis - Add Xyzal once daily over the counter  - Checking CXR today  - Follow up in 3 months with PFTs (may need maintenance inhaler adjustment)

## 2019-11-09 NOTE — Patient Instructions (Addendum)
Nice seeing you today  Recommendations: Symbicort 80 two puff twice daily  Xyzal over the counter - take at bedtime  Replace aerochamber   Rx: Prednisone taper as prescribed  Orders: CXR today Pulmonary function testing   Follow-up: 3 months with PFTs - Dr. Halford Chessman or [facility]

## 2019-11-09 NOTE — Progress Notes (Signed)
Reviewed and agree with assessment/plan.   Chesley Mires, MD [facility] Pulmonary/Critical Care 11/09/2019, 12:58 PM Pager:  [phone]

## 2019-11-10 NOTE — Progress Notes (Signed)
Please let patient know CXR showed scarring left base which is stable, elsewhere lungs are clear. Nothing further

## 2019-11-14 DIAGNOSIS — L570 Actinic keratosis: Secondary | ICD-10-CM | POA: Diagnosis not present

## 2019-12-06 DIAGNOSIS — Z20822 Contact with and (suspected) exposure to covid-19: Secondary | ICD-10-CM | POA: Diagnosis not present

## 2020-01-20 DIAGNOSIS — R42 Dizziness and giddiness: Secondary | ICD-10-CM | POA: Diagnosis not present

## 2020-02-09 DIAGNOSIS — Z800 Family history of malignant neoplasm of digestive organs: Secondary | ICD-10-CM | POA: Diagnosis not present

## 2020-02-09 DIAGNOSIS — Z1211 Encounter for screening for malignant neoplasm of colon: Secondary | ICD-10-CM | POA: Diagnosis not present

## 2020-02-09 DIAGNOSIS — K219 Gastro-esophageal reflux disease without esophagitis: Secondary | ICD-10-CM | POA: Diagnosis not present

## 2020-02-09 NOTE — Progress Notes (Signed)
@Patient  ID: Michele Reyes, female    DOB: 1950/10/28, 69 y.o.   MRN: [id number]  Chief Complaint  Patient presents with  . Follow-up    PFT performed today and pt is here to discuss results. Pt states she has been okay since last visit and denies any real complaints.    Referring provider: Josefa Half*  HPI: 69 year old female, former smoker quit in 1995. PMH significant for mild intermittent asthma, emphysema of lung, chronic rhinitis, chronic cough. Patient of Dr. Halford Chessman. Dulera changed to Symbicort 80 in October 2018.   Previous [facility] pulmonary encounter: 11/09/2019 Patient presents today for acute visit for shortness of breath. She has not been seen in our office in close to 3 years. Over the last year with covid and having to wear mask she feels her breathing has worsened. She reports having shortness of breath daily. She reports intermittent wheezing during the day and at night, chest tightness and cough. She is compliant with Symbicort two puffs twice daily. She uses Albuterol rescue inhaler several times a week. Her weight has been stable over the last 2 years.   02/10/2020- Interim hx Patient presents today for 3 month follow-up. She reports increased shortness of breath since pandemic and having to wear a mask. She has difficulty wearing mask for longer than 15 mins. She has not had covid that she knows of.  Compliant with Symbicort 80 two puffs twice daily. Pulmonary function testing was completed today which showed moderate restriction and severe diffusion defect but tech advised me that there was an error with calibration, repeated spirometry same day showed mild obstruction.   PFT: 02/10/2020  FVC 1.88 (60%), FEV1 1.58 (66%), ratio 84, DLCO 4.66 (23%)  *Error with pulmonary function testing calibration, see below for repeat spirometry  Repeat spirometry 02/10/2020 FVC 2.94 (94%), FEV1 1.97 (83%), ratio 67, DLCO 18.3 (92%)  Tests: Spirometry 09/29/08 >> FEV1  2.55(100%), FEV1% 72 11/16/08 IgE 45 CT sinus 01/24/10>>b/l maxillary sinusitis CXR 01/23/12>>hyperinflation, peribronchial thickening PFT 02/25/12>>FEV1 2.41 (105%), FEV1% 63, TLC 5.28 (108%), DLCO 75%, no BD Spirometry 08/10/12 >> FEV1 1.95 (79%), FEV1% 69 CT chest 12/27/14 >> mild emphysema, ATX RLL PFT 01/02/15 >> FEV1 2.43 (97%), FEV1% 74, TLC 5.02 (99%), DLCO 81%, no BD  02/10/2020 PFTs- FVC 1.88 (60%), FEV1 1.58 (66%), ratio 84, DLCOunc 4.66 (28%)  Allergies  Allergen Reactions  . Azithromycin Hives  . Levaquin [Levofloxacin] Hives  . Penicillins     REACTION: hives  . Sulfa Antibiotics Hives    Immunization History  Administered Date(s) Administered  . Influenza, High Dose Seasonal PF 02/09/2019, 11/23/2019  . Influenza-Unspecified 12/18/2010, 02/10/2019  . Moderna SARS-COVID-2 Vaccination 04/27/2019, 05/25/2019  . Pneumococcal Conjugate-13 08/03/2017, 02/09/2019  . Tdap 03/25/2008    Past Medical History:  Diagnosis Date  . Asthma   . Headache(784.0)   . Hypertension   . Psoriasis   . Rhinitis     Tobacco History: Social History   Tobacco Use  Smoking Status Former Smoker  . Packs/day: 2.00  . Years: 10.00  . Pack years: 20.00  . Types: Cigarettes  . Quit date: 03/24/1993  . Years since quitting: 26.9  Smokeless Tobacco Never Used   Counseling given: Not Answered   Outpatient Medications Prior to Visit  Medication Sig Dispense Refill  . albuterol (PROAIR HFA) 108 (90 BASE) MCG/ACT inhaler Inhale 2 puffs into the lungs every 6 (six) hours as needed for wheezing. 1 Inhaler 2  . Cholecalciferol (VITAMIN D3) 5000  UNITS CAPS Take 5,000 Units by mouth daily.     . DULoxetine (CYMBALTA) 30 MG capsule Take 30 mg by mouth daily.    Marland Kitchen ibuprofen (ADVIL,MOTRIN) 200 MG tablet Take 200 mg by mouth every 6 (six) hours as needed for pain.    Marland Kitchen KRILL OIL PO Take 1 capsule by mouth daily.    Marland Kitchen losartan-hydrochlorothiazide (HYZAAR) 50-12.5 MG per tablet Take 1 tablet  by mouth daily.    . Multiple Vitamins-Minerals (WOMENS ONE DAILY) TABS Take 1 tablet by mouth daily.    . NON FORMULARY Nature's Own shake powder - veggies and supplements    . omeprazole (PRILOSEC) 40 MG capsule Take 40 mg by mouth daily.    Marland Kitchen Spacer/Aero-Holding Chambers (AEROCHAMBER MV) inhaler Use as instructed 1 each 0  . TURMERIC PO Take by mouth daily. Mixed in with shake    . budesonide-formoterol (SYMBICORT) 80-4.5 MCG/ACT inhaler Inhale 2 puffs into the lungs 2 (two) times daily. 3 Inhaler 0  . medium chain triglycerides (MCT OIL) oil Take 15 mLs by mouth daily.    . mometasone-formoterol (DULERA) 100-5 MCG/ACT AERO Inhale 2 puffs into the lungs 2 (two) times daily. 1 Inhaler 0  . predniSONE (DELTASONE) 10 MG tablet Take 4 tabs po daily x 3 days; then 3 tabs daily x3 days; then 2 tabs daily x3 days; then 1 tab daily x 3 days; then stop 30 tablet 0   No facility-administered medications prior to visit.    Review of Systems  Review of Systems  Constitutional: Negative.   Respiratory: Positive for shortness of breath.   Cardiovascular: Negative.    Physical Exam  BP 130/80 (BP Location: Left Arm, Cuff Size: Large)   Pulse 85   Temp 98.1 F (36.7 C) (Other (Comment)) Comment (Src): wrist  Ht 5\' 4"  (1.626 m)   Wt 237 lb 6.4 oz (107.7 kg)   SpO2 95%   BMI 40.75 kg/m  Physical Exam Constitutional:      Appearance: Normal appearance.  Cardiovascular:     Rate and Rhythm: Normal rate and regular rhythm.  Pulmonary:     Effort: Pulmonary effort is normal.     Breath sounds: Normal breath sounds. No wheezing, rhonchi or rales.  Neurological:     Mental Status: She is alert.      Lab Results:  CBC    Component Value Date/Time   WBC 6.5 06/28/2019 1130   RBC 4.83 06/28/2019 1130   HGB 14.3 06/28/2019 1130   HCT 44.3 06/28/2019 1130   PLT 324 06/28/2019 1130   MCV 91.7 06/28/2019 1130   MCH 29.6 06/28/2019 1130   MCHC 32.3 06/28/2019 1130   RDW 13.6 06/28/2019  1130   LYMPHSABS 2.5 12/16/2016 0823   MONOABS 0.6 12/16/2016 0823   EOSABS 0.1 12/16/2016 0823   BASOSABS 0.1 12/16/2016 0823    BMET    Component Value Date/Time   NA 139 06/28/2019 1130   K 4.4 06/28/2019 1130   CL 106 06/28/2019 1130   CO2 24 06/28/2019 1130   GLUCOSE 93 06/28/2019 1130   BUN 26 (H) 06/28/2019 1130   CREATININE 0.85 06/28/2019 1130   CALCIUM 9.3 06/28/2019 1130   GFRNONAA >60 06/28/2019 1130   GFRAA >60 06/28/2019 1130    BNP No results found for: BNP  ProBNP No results found for: PROBNP  Imaging: No results found.   Assessment & Plan:   Moderate persistent asthma - Lung function appears worse on PFTs. This may  have been an error with our equipment, we will repeat Spirometry today. Regardless, she has noted a decline in her breathing and her FENO was elevated at 106. We will increase her Symbicort to 161mcg two puff daily day. Checking CBC with diff, IgE and Alpha 1. We will likely at somepoint need to consider biologic. Recommend fu with Dr. Halford Chessman in 4 weeks or first available.    Martyn Ehrich, NP 02/10/2020

## 2020-02-10 ENCOUNTER — Encounter: Payer: Self-pay | Admitting: Primary Care

## 2020-02-10 ENCOUNTER — Other Ambulatory Visit: Payer: Self-pay | Admitting: *Deleted

## 2020-02-10 ENCOUNTER — Other Ambulatory Visit: Payer: Self-pay

## 2020-02-10 ENCOUNTER — Ambulatory Visit (INDEPENDENT_AMBULATORY_CARE_PROVIDER_SITE_OTHER): Payer: PPO | Admitting: Primary Care

## 2020-02-10 ENCOUNTER — Ambulatory Visit (INDEPENDENT_AMBULATORY_CARE_PROVIDER_SITE_OTHER): Payer: PPO | Admitting: Pulmonary Disease

## 2020-02-10 VITALS — BP 130/80 | HR 85 | Temp 98.1°F | Ht 64.0 in | Wt 237.4 lb

## 2020-02-10 DIAGNOSIS — J45909 Unspecified asthma, uncomplicated: Secondary | ICD-10-CM

## 2020-02-10 DIAGNOSIS — R0602 Shortness of breath: Secondary | ICD-10-CM

## 2020-02-10 DIAGNOSIS — J4541 Moderate persistent asthma with (acute) exacerbation: Secondary | ICD-10-CM

## 2020-02-10 LAB — CBC WITH DIFFERENTIAL/PLATELET
Basophils Absolute: 0.1 10*3/uL (ref 0.0–0.1)
Basophils Relative: 1.1 % (ref 0.0–3.0)
Eosinophils Absolute: 0 10*3/uL (ref 0.0–0.7)
Eosinophils Relative: 0.6 % (ref 0.0–5.0)
HCT: 42.1 % (ref 36.0–46.0)
Hemoglobin: 14 g/dL (ref 12.0–15.0)
Lymphocytes Relative: 39 % (ref 12.0–46.0)
Lymphs Abs: 2.3 10*3/uL (ref 0.7–4.0)
MCHC: 33.2 g/dL (ref 30.0–36.0)
MCV: 88.6 fl (ref 78.0–100.0)
Monocytes Absolute: 0.6 10*3/uL (ref 0.1–1.0)
Monocytes Relative: 9.7 % (ref 3.0–12.0)
Neutro Abs: 2.9 10*3/uL (ref 1.4–7.7)
Neutrophils Relative %: 49.6 % (ref 43.0–77.0)
Platelets: 287 10*3/uL (ref 150.0–400.0)
RBC: 4.75 Mil/uL (ref 3.87–5.11)
RDW: 14.1 % (ref 11.5–15.5)
WBC: 5.8 10*3/uL (ref 4.0–10.5)

## 2020-02-10 LAB — PULMONARY FUNCTION TEST
DL/VA % pred: 93 %
DL/VA % pred: 93 %
DL/VA: 3.9 ml/min/mmHg/L
DL/VA: 3.9 ml/min/mmHg/L
DLCO cor % pred: 23 %
DLCO cor: 4.66 ml/min/mmHg
DLCO unc % pred: 23 %
DLCO unc % pred: 92 %
DLCO unc: 4.66 ml/min/mmHg
DLCO unc: 18.3 ml/min/mmHg
FEF 25-75 Post: 1.84 L/sec
FEF 25-75 Pre: 0.67 L/sec
FEF 25-75 Pre: 1.09 L/sec
FEF2575-%Change-Post: 174 %
FEF2575-%Pred-Post: 90 %
FEF2575-%Pred-Pre: 32 %
FEF2575-%Pred-Pre: 53 %
FEV1-%Change-Post: 37 %
FEV1-%Pred-Post: 66 %
FEV1-%Pred-Pre: 48 %
FEV1-%Pred-Pre: 83 %
FEV1-Post: 1.58 L
FEV1-Pre: 1.14 L
FEV1-Pre: 1.97 L
FEV1FVC-%Change-Post: 18 %
FEV1FVC-%Pred-Pre: 92 %
FEV1FVC-%Pred-Pre: 87 %
FEV6-%Change-Post: 18 %
FEV6-%Pred-Post: 62 %
FEV6-%Pred-Pre: 52 %
FEV6-%Pred-Pre: 97 %
FEV6-Post: 1.86 L
FEV6-Pre: 1.57 L
FEV6-Pre: 2.9 L
FEV6FVC-%Change-Post: 1 %
FEV6FVC-%Pred-Post: 103 %
FEV6FVC-%Pred-Pre: 101 %
FEV6FVC-%Pred-Pre: 103 %
FVC-%Change-Post: 16 %
FVC-%Pred-Post: 60 %
FVC-%Pred-Pre: 52 %
FVC-%Pred-Pre: 94 %
FVC-Post: 1.88 L
FVC-Pre: 1.62 L
FVC-Pre: 2.94 L
Post FEV1/FVC ratio: 84 %
Post FEV6/FVC ratio: 99 %
Pre FEV1/FVC ratio: 71 %
Pre FEV1/FVC ratio: 67 %
Pre FEV6/FVC Ratio: 98 %
Pre FEV6/FVC Ratio: 99 %
RV % pred: 72 %
RV % pred: 115 %
RV: 1.53 L
RV: 2.47 L
TLC % pred: 81 %
TLC % pred: 107 %
TLC: 4.1 L
TLC: 5.44 L

## 2020-02-10 LAB — NITRIC OXIDE: Nitric Oxide: 105

## 2020-02-10 MED ORDER — BUDESONIDE-FORMOTEROL FUMARATE 160-4.5 MCG/ACT IN AERO: 2.0000 | INHALATION_SPRAY | Freq: Two times a day (BID) | RESPIRATORY_TRACT | 6 refills | Status: AC

## 2020-02-10 MED ORDER — MONTELUKAST SODIUM 10 MG PO TABS: 10.0000 mg | ORAL_TABLET | Freq: Every day | ORAL | 11 refills | Status: AC

## 2020-02-10 NOTE — Assessment & Plan Note (Addendum)
-   Lung function appears worse on PFTs. This may have been an error with our equipment, we will repeat Spirometry today. Regardless, she has noted a decline in her breathing and her FENO was elevated at 106. We will increase her Symbicort to 179mcg two puff daily day. Checking CBC with diff, IgE and Alpha 1. We will likely at somepoint need to consider biologic. Recommend fu with Dr. Halford Chessman in 4 weeks or first available.

## 2020-02-10 NOTE — Progress Notes (Signed)
PFT done today. 

## 2020-02-10 NOTE — Patient Instructions (Addendum)
Pulmonary function testing today shwoed a decreased in lung function since 2018. You have moderate restriction with a positive bronchodilator response. This is cosistent with moderate asthma.   Your diffusion capacity was also decreased, we will check your hemaglobin level and correlate this with your pulmonary testing. I recommend a CT chest to monitor emphysema which I suspect is some worse.   We are also increasing your Symbicort to 160 to better control your asthma. We are also adding a medication called Singulair (montelukast) for asthma/allergies. Continue Xyzal as needed.  Labs today for allergy markers and genetic test.   Recommend you have mold professionally cleaned and move out (stay somewhere else) while having this done   Orders: HRCT re: shortness of breath Labs (cbc, IgE, Alpha 1) FENO re: asthma  Rx: Symbicort 160- take two puffs twice daily (rinse mouth after use) Singular 10mg  at bedtime   Follow-up: 4-6 weeks with Dr. Halford Chessman only (or his next available slot)   Asthma, Adult  Asthma is a long-term (chronic) condition in which the airways get tight and narrow. The airways are the breathing passages that lead from the nose and mouth down into the lungs. A person with asthma will have times when symptoms get worse. These are called asthma attacks. They can cause coughing, whistling sounds when you breathe (wheezing), shortness of breath, and chest pain. They can make it hard to breathe. There is no cure for asthma, but medicines and lifestyle changes can help control it. There are many things that can bring on an asthma attack or make asthma symptoms worse (triggers). Common triggers include:  Mold.  Dust.  Cigarette smoke.  Cockroaches.  Things that can cause allergy symptoms (allergens). These include animal skin flakes (dander) and pollen from trees or grass.  Things that pollute the air. These may include household cleaners, wood smoke, smog, or chemical  odors.  Cold air, weather changes, and wind.  Crying or laughing hard.  Stress.  Certain medicines or drugs.  Certain foods such as dried fruit, potato chips, and grape juice.  Infections, such as a cold or the flu.  Certain medical conditions or diseases.  Exercise or tiring activities. Asthma may be treated with medicines and by staying away from the things that cause asthma attacks. Types of medicines may include:  Controller medicines. These help prevent asthma symptoms. They are usually taken every day.  Fast-acting reliever or rescue medicines. These quickly relieve asthma symptoms. They are used as needed and provide short-term relief.  Allergy medicines if your attacks are brought on by allergens.  Medicines to help control the body's defense (immune) system. Follow these instructions at home: Avoiding triggers in your home  Change your heating and air conditioning filter often.  Limit your use of fireplaces and wood stoves.  Get rid of pests (such as roaches and mice) and their droppings.  Throw away plants if you see mold on them.  Clean your floors. Dust regularly. Use cleaning products that do not smell.  Have someone vacuum when you are not home. Use a vacuum cleaner with a HEPA filter if possible.  Replace carpet with wood, tile, or vinyl flooring. Carpet can trap animal skin flakes and dust.  Use allergy-proof pillows, mattress covers, and box spring covers.  Wash bed sheets and blankets every week in hot water. Dry them in a dryer.  Keep your bedroom free of any triggers.  Avoid pets and keep windows closed when things that cause allergy symptoms are in  the air.  Use blankets that are made of polyester or cotton.  Clean bathrooms and kitchens with bleach. If possible, have someone repaint the walls in these rooms with mold-resistant paint. Keep out of the rooms that are being cleaned and painted.  Wash your hands often with soap and water. If  soap and water are not available, use hand sanitizer.  Do not allow anyone to smoke in your home. General instructions  Take over-the-counter and prescription medicines only as told by your doctor. ? Talk with your doctor if you have questions about how or when to take your medicines. ? Make note if you need to use your medicines more often than usual.  Do not use any products that contain nicotine or tobacco, such as cigarettes and e-cigarettes. If you need help quitting, ask your doctor.  Stay away from secondhand smoke.  Avoid doing things outdoors when allergen counts are high and when air quality is low.  Wear a ski mask when doing outdoor activities in the winter. The mask should cover your nose and mouth. Exercise indoors on cold days if you can.  Warm up before you exercise. Take time to cool down after exercise.  Use a peak flow meter as told by your doctor. A peak flow meter is a tool that measures how well the lungs are working.  Keep track of the peak flow meter's readings. Write them down.  Follow your asthma action plan. This is a written plan for taking care of your asthma and treating your attacks.  Make sure you get all the shots (vaccines) that your doctor recommends. Ask your doctor about a flu shot and a pneumonia shot.  Keep all follow-up visits as told by your doctor. This is important. Contact a doctor if:  You have wheezing, shortness of breath, or a cough even while taking medicine to prevent attacks.  The mucus you cough up (sputum) is thicker than usual.  The mucus you cough up changes from clear or white to yellow, green, gray, or bloody.  You have problems from the medicine you are taking, such as: ? A rash. ? Itching. ? Swelling. ? Trouble breathing.  You need reliever medicines more than 2-3 times a week.  Your peak flow reading is still at 50-79% of your personal best after following the action plan for 1 hour.  You have a fever. Get help  right away if:  You seem to be worse and are not responding to medicine during an asthma attack.  You are short of breath even at rest.  You get short of breath when doing very little activity.  You have trouble eating, drinking, or talking.  You have chest pain or tightness.  You have a fast heartbeat.  Your lips or fingernails start to turn blue.  You are light-headed or dizzy, or you faint.  Your peak flow is less than 50% of your personal best.  You feel too tired to breathe normally. Summary  Asthma is a long-term (chronic) condition in which the airways get tight and narrow. An asthma attack can make it hard to breathe.  Asthma cannot be cured, but medicines and lifestyle changes can help control it.  Make sure you understand how to avoid triggers and how and when to use your medicines. This information is not intended to replace advice given to you by your health care provider. Make sure you discuss any questions you have with your health care provider. Document Revised: 05/13/2018 Document Reviewed:  04/14/2016 Elsevier Patient Education  [employer].

## 2020-02-20 NOTE — Progress Notes (Signed)
Reviewed and agree with assessment/plan.   Chesley Mires, MD [facility] Pulmonary/Critical Care 02/20/2020, 12:50 PM Pager:  [phone]

## 2020-02-29 ENCOUNTER — Other Ambulatory Visit: Payer: Self-pay

## 2020-03-07 DIAGNOSIS — E785 Hyperlipidemia, unspecified: Secondary | ICD-10-CM | POA: Diagnosis not present

## 2020-03-07 DIAGNOSIS — I10 Essential (primary) hypertension: Secondary | ICD-10-CM | POA: Diagnosis not present

## 2020-03-07 DIAGNOSIS — Z0000 Encounter for general adult medical examination without abnormal findings: Secondary | ICD-10-CM | POA: Diagnosis not present

## 2020-03-07 DIAGNOSIS — Z23 Encounter for immunization: Secondary | ICD-10-CM | POA: Diagnosis not present

## 2020-03-07 DIAGNOSIS — E559 Vitamin D deficiency, unspecified: Secondary | ICD-10-CM | POA: Diagnosis not present

## 2020-03-07 DIAGNOSIS — K219 Gastro-esophageal reflux disease without esophagitis: Secondary | ICD-10-CM | POA: Diagnosis not present

## 2020-03-09 DIAGNOSIS — Z1231 Encounter for screening mammogram for malignant neoplasm of breast: Secondary | ICD-10-CM | POA: Diagnosis not present

## 2020-03-09 LAB — ALLERGY PANEL 11, MOLD GROUP
Allergen, A. alternata, m6: <0.1 kU/L
Allergen, Mucor Racemosus, M4: <0.1 kU/L
Aspergillus fumigatus, m3: <0.1 kU/L
CLADOSPORIUM HERBARUM (M2) IGE: <0.1 kU/L
CLASS: 0
CLASS: 0
Candida Albicans: <0.1 kU/L
Class: 0
Class: 0
Class: 0

## 2020-03-09 LAB — INTERPRETATION:

## 2020-03-09 LAB — IGE: IgE (Immunoglobulin E), Serum: 190 kU/L — ABNORMAL HIGH (ref <=114)

## 2020-03-09 LAB — ALPHA-1 ANTITRYPSIN PHENOTYPE: A-1 Antitrypsin, Ser: 174 mg/dL (ref 83–199)

## 2020-03-12 NOTE — Progress Notes (Signed)
Please let patient know Alpha 1 level was normal. IgE was elevated. She has a fu with Dr. Halford Chessman in Jan, can discuss further

## 2020-03-13 ENCOUNTER — Emergency Department (HOSPITAL_COMMUNITY)
Admission: EM | Admit: 2020-03-13 | Discharge: 2020-03-14 | Disposition: A | Discharge status: Home / Self Care | Payer: PPO | Attending: Emergency Medicine | Admitting: Emergency Medicine

## 2020-03-13 ENCOUNTER — Other Ambulatory Visit: Payer: Self-pay

## 2020-03-13 ENCOUNTER — Encounter (HOSPITAL_COMMUNITY): Payer: Self-pay

## 2020-03-13 DIAGNOSIS — Z20822 Contact with and (suspected) exposure to covid-19: Secondary | ICD-10-CM | POA: Insufficient documentation

## 2020-03-13 DIAGNOSIS — I959 Hypotension, unspecified: Secondary | ICD-10-CM | POA: Diagnosis not present

## 2020-03-13 DIAGNOSIS — R000 Tachycardia, unspecified: Secondary | ICD-10-CM | POA: Insufficient documentation

## 2020-03-13 DIAGNOSIS — Z79899 Other long term (current) drug therapy: Secondary | ICD-10-CM | POA: Diagnosis not present

## 2020-03-13 DIAGNOSIS — J4541 Moderate persistent asthma with (acute) exacerbation: Secondary | ICD-10-CM

## 2020-03-13 DIAGNOSIS — R062 Wheezing: Secondary | ICD-10-CM | POA: Diagnosis not present

## 2020-03-13 DIAGNOSIS — J45909 Unspecified asthma, uncomplicated: Secondary | ICD-10-CM | POA: Diagnosis not present

## 2020-03-13 DIAGNOSIS — I10 Essential (primary) hypertension: Secondary | ICD-10-CM | POA: Diagnosis not present

## 2020-03-13 DIAGNOSIS — Z87891 Personal history of nicotine dependence: Secondary | ICD-10-CM | POA: Diagnosis not present

## 2020-03-13 DIAGNOSIS — R0902 Hypoxemia: Secondary | ICD-10-CM | POA: Diagnosis not present

## 2020-03-13 DIAGNOSIS — R0602 Shortness of breath: Secondary | ICD-10-CM | POA: Diagnosis not present

## 2020-03-13 LAB — BASIC METABOLIC PANEL
Anion gap: 13 (ref 5–15)
BUN: 15 mg/dL (ref 8–23)
CO2: 24 mmol/L (ref 22–32)
Calcium: 9.1 mg/dL (ref 8.9–10.3)
Chloride: 97 mmol/L — ABNORMAL LOW (ref 98–111)
Creatinine, Ser: 0.82 mg/dL (ref 0.44–1.00)
GFR, Estimated: >60 mL/min (ref >60)
Glucose, Bld: 134 mg/dL — ABNORMAL HIGH (ref 70–99)
Potassium: 3.4 mmol/L — ABNORMAL LOW (ref 3.5–5.1)
Sodium: 134 mmol/L — ABNORMAL LOW (ref 135–145)

## 2020-03-13 LAB — CBC
HCT: 42.4 % (ref 36.0–46.0)
Hemoglobin: 14.4 g/dL (ref 12.0–15.0)
MCH: 30.5 pg (ref 26.0–34.0)
MCHC: 34 g/dL (ref 30.0–36.0)
MCV: 89.8 fL (ref 80.0–100.0)
Platelets: 290 10*3/uL (ref 150–400)
RBC: 4.72 MIL/uL (ref 3.87–5.11)
RDW: 13.4 % (ref 11.5–15.5)
WBC: 10.1 10*3/uL (ref 4.0–10.5)
nRBC: 0 % (ref 0.0–0.2)

## 2020-03-13 LAB — BRAIN NATRIURETIC PEPTIDE: B Natriuretic Peptide: 69 pg/mL (ref 0.0–100.0)

## 2020-03-13 NOTE — ED Triage Notes (Signed)
Pt BIB [facility] for eval of SHOB; hx asthma, inhaler provided no relief. Complaint of 24-36hr SHOB, +1 pedal edema. Went from 1-2 words w dyspnea to full sentences without issue. Given duoneb & 125 solumedrol PTA, O2 sats improved from mid-80's to mid-90's after.  20g L hand VSS, EKG unremarkable

## 2020-03-14 ENCOUNTER — Emergency Department (HOSPITAL_COMMUNITY): Payer: PPO

## 2020-03-14 DIAGNOSIS — R000 Tachycardia, unspecified: Secondary | ICD-10-CM | POA: Diagnosis not present

## 2020-03-14 DIAGNOSIS — R0602 Shortness of breath: Secondary | ICD-10-CM | POA: Diagnosis not present

## 2020-03-14 DIAGNOSIS — J45909 Unspecified asthma, uncomplicated: Secondary | ICD-10-CM | POA: Diagnosis not present

## 2020-03-14 DIAGNOSIS — J4541 Moderate persistent asthma with (acute) exacerbation: Secondary | ICD-10-CM | POA: Diagnosis not present

## 2020-03-14 LAB — RESP PANEL BY RT-PCR (FLU A&B, COVID) ARPGX2
Influenza A by PCR: NEGATIVE
Influenza B by PCR: NEGATIVE
SARS Coronavirus 2 by RT PCR: NEGATIVE

## 2020-03-14 MED ORDER — METHYLPREDNISOLONE SODIUM SUCC 125 MG IJ SOLR
125.0000 mg | Freq: Once | INTRAMUSCULAR | Status: AC
  Administered 2020-03-14: 15:00:00 125 mg via INTRAVENOUS
  Filled 2020-03-14: qty 2

## 2020-03-14 MED ORDER — ALBUTEROL SULFATE (2.5 MG/3ML) 0.083% IN NEBU: 2.5000 mg | INHALATION_SOLUTION | Freq: Four times a day (QID) | RESPIRATORY_TRACT | 12 refills | Status: AC | PRN

## 2020-03-14 MED ORDER — PREDNISONE 20 MG PO TABS: 60.0000 mg | ORAL_TABLET | Freq: Every day | ORAL | 0 refills | Status: DC

## 2020-03-14 MED ORDER — ALBUTEROL SULFATE HFA 108 (90 BASE) MCG/ACT IN AERS
4.0000 | INHALATION_SPRAY | Freq: Once | RESPIRATORY_TRACT | Status: AC
  Administered 2020-03-14: 13:00:00 4 via RESPIRATORY_TRACT
  Filled 2020-03-14: qty 6.7

## 2020-03-14 MED ORDER — IPRATROPIUM-ALBUTEROL 0.5-2.5 (3) MG/3ML IN SOLN
3.0000 mL | Freq: Once | RESPIRATORY_TRACT | Status: AC
  Administered 2020-03-14: 15:00:00 3 mL via RESPIRATORY_TRACT
  Filled 2020-03-14: qty 3

## 2020-03-14 NOTE — ED Notes (Signed)
Patient states that breathing tx she received earlier is wearing off and she feels herself filling back up again. Triage RN notified and told this NT to place pt on 2L O2.

## 2020-03-14 NOTE — ED Provider Notes (Signed)
[facility] Provider Note  CSN: [id number] Arrival date & time: 03/13/20 2218    History Chief Complaint  Patient presents with  . Shortness of Breath    HPI  Michele Reyes is a 69 y.o. female with history of asthma managed by Pulmonology with Symbicort, Singulair, Albuterol MDI report she had been doing well recently until yesterday when she started getting more SOB, wheezing and cough productive of clear sputum. No reported fever. She was using her inhalers were not effective at home and so called EMS last night. She was reportedly hypoxic with EMS, given solumedrol and albuterol neb with improvement. She has since worsened again due to prolonged ED wait times.    Past Medical History:  Diagnosis Date  . Asthma   . Headache(784.0)   . Hypertension   . Psoriasis   . Rhinitis     Past Surgical History:  Procedure Laterality Date  . NASAL SINUS SURGERY  2001    Family History  Problem Relation Age of Onset  . Allergies Brother        x2  . Allergies Sister   . Hypertension Mother   . Heart attack Mother   . Colon cancer Mother   . Hypertension Father   . Heart attack Father   . Heart failure Father   . Breast cancer Sister     Social History   Tobacco Use  . Smoking status: Former Smoker    Packs/day: 2.00    Years: 10.00    Pack years: 20.00    Types: Cigarettes    Quit date: 03/24/1993    Years since quitting: 26.9  . Smokeless tobacco: Never Used  Substance Use Topics  . Alcohol use: Yes    Comment: occasionally  . Drug use: No     Home Medications Prior to Admission medications   Medication Sig Start Date End Date Taking? Authorizing Provider  albuterol (PROAIR HFA) 108 (90 BASE) MCG/ACT inhaler Inhale 2 puffs into the lungs every 6 (six) hours as needed for wheezing. 03/06/15  Yes Chesley Mires, MD  budesonide-formoterol (SYMBICORT) 160-4.5 MCG/ACT inhaler Inhale 2 puffs into the lungs 2 (two) times daily. 02/10/20  Yes  Martyn Ehrich, NP  Cholecalciferol (VITAMIN D-3) 25 MCG (1000 UT) CAPS Take 1,000 Units by mouth daily with breakfast.   Yes [provider]  DULoxetine (CYMBALTA) 30 MG capsule Take 30 mg by mouth daily. 08/02/19  Yes [provider]  ibuprofen (ADVIL,MOTRIN) 200 MG tablet Take 400-600 mg by mouth every 6 (six) hours as needed for pain.   Yes [provider]  KRILL OIL PO Take 1 capsule by mouth daily.   Yes [provider]  losartan-hydrochlorothiazide (HYZAAR) 100-12.5 MG tablet Take 1 tablet by mouth daily.   Yes [provider]  Multiple Vitamins-Minerals (WOMENS ONE DAILY) TABS Take 1 tablet by mouth daily.   Yes [provider]  naproxen sodium (ALEVE) 220 MG tablet Take 220 mg by mouth daily as needed (for pain).   Yes [provider]  NON FORMULARY Take by mouth See admin instructions. "Nature's Own" shake powder- Mix 1 shake and drink once a day   Yes [provider]  omeprazole (PRILOSEC) 40 MG capsule Take 40 mg by mouth daily with breakfast. 10/25/19  Yes [provider]  TURMERIC PO Take by mouth See admin instructions. "Golden Milkshake"- Mix powder into soup or a shake and drink once a day   Yes [provider]  albuterol (PROVENTIL) (2.5 MG/3ML) 0.083% nebulizer solution Take 3 mLs (2.5 mg total) by nebulization every 6 (six) hours as needed for wheezing or shortness of breath. 03/14/20   Truddie Hidden, MD  montelukast (SINGULAIR) 10 MG tablet Take 1 tablet (10 mg total) by mouth at bedtime. 02/10/20   Martyn Ehrich, NP  predniSONE (DELTASONE) 20 MG tablet Take 3 tablets (60 mg total) by mouth daily. 03/14/20   Truddie Hidden, MD  Spacer/Aero-Holding Josiah Lobo (AEROCHAMBER MV) inhaler Use as instructed 01/14/13   Chesley Mires, MD     Allergies    Azithromycin, Levofloxacin, Penicillin g, Penicillins, Sulfa antibiotics, and Sulfamethoxazole-trimethoprim   Review of Systems    Review of Systems A comprehensive review of systems was completed and negative except as noted in HPI.    Physical Exam BP (!) 142/99   Pulse 93   Temp 98.1 F (36.7 C) (Oral)   Resp 16   SpO2 94%   Physical Exam Vitals and nursing note reviewed.  Constitutional:      Appearance: Normal appearance.  HENT:     Head: Normocephalic and atraumatic.     Nose: Nose normal.     Mouth/Throat:     Mouth: Mucous membranes are moist.  Eyes:     Extraocular Movements: Extraocular movements intact.     Conjunctiva/sclera: Conjunctivae normal.  Cardiovascular:     Rate and Rhythm: Normal rate.  Pulmonary:     Effort: Accessory muscle usage present.     Breath sounds: Wheezing present.     Comments: Increased work of breathing; speaks in broken sentences Abdominal:     General: Abdomen is flat.     Palpations: Abdomen is soft.     Tenderness: There is no abdominal tenderness.  Musculoskeletal:        General: No swelling. Normal range of motion.     Cervical back: Neck supple.  Skin:    General: Skin is warm and dry.  Neurological:     General: No focal deficit present.     Mental Status: She is alert.  Psychiatric:        Mood and Affect: Mood normal.      ED Results / Procedures / Treatments   Labs (all labs ordered are listed, but only abnormal results are displayed) Labs Reviewed  BASIC METABOLIC PANEL - Abnormal; Notable for the following components:      Result Value   Sodium 134 (*)    Potassium 3.4 (*)    Chloride 97 (*)    Glucose, Bld 134 (*)    All other components within normal limits  RESP PANEL BY RT-PCR (FLU A&B, COVID) [id number]  CBC  BRAIN NATRIURETIC PEPTIDE    EKG EKG Interpretation  Date/Time:  Tuesday March 13 2020 22:17:55 EST Ventricular Rate:  108 PR Interval:  156 QRS Duration: 74 QT Interval:  356 QTC Calculation: 477 R Axis:   12 Text Interpretation: Sinus tachycardia Otherwise normal ECG No STEMI Confirmed by Nanda Quinton  [phone]) on 03/14/2020 4:34:03 PM   Radiology DG Chest Port 1 View  Result Date: 03/14/2020 CLINICAL DATA:  Shortness of breath.  History of asthma. EXAM: PORTABLE CHEST 1 VIEW COMPARISON:  Chest x-ray dated November 09, 2019. FINDINGS: The heart size and mediastinal contours are within normal limits. Both lungs are clear. The visualized skeletal structures are unremarkable. IMPRESSION: No active disease. Electronically Signed   By: Titus Dubin M.D.   On: 03/14/2020 13:05  Procedures Procedures  Medications Ordered in the ED Medications  albuterol (VENTOLIN HFA) 108 (90 Base) MCG/ACT inhaler 4 puff (4 puffs Inhalation Given 03/14/20 1323)  methylPREDNISolone sodium succinate (SOLU-MEDROL) 125 mg/2 mL injection 125 mg (125 mg Intravenous Given 03/14/20 1445)  ipratropium-albuterol (DUONEB) 0.5-2.5 (3) MG/3ML nebulizer solution 3 mL (3 mLs Nebulization Given 03/14/20 1439)     MDM Rules/Calculators/A&P MDM Patient with likely asthma exacerbation unfortunately had a prolonged wait and has worsened again since arrival. She is not hypoxic on my evaluation. Will need to confirm Covid status prior to nebs however. Will give additional dose of solumedrol, albuterol MDI and add CXR/Covid to labs done last night.  ED Course  I have reviewed the triage vital signs and the nursing notes.  Pertinent labs & imaging results that were available during my care of the patient were reviewed by me and considered in my medical decision making (see chart for details).  Clinical Course as of 03/15/20 0708  Wed Mar 14, 2020  1318 CXR neg.  [CS]  1410 Covid is neg, will proceed with Duoneb.  [CS]  [id number] Patient feeling better after neb. Will turn off her supplemental oxygen, observe for worsening SOB or hypoxia and if she continues to do well plan discharge with burst of steroids, Rx for nebulizer machine and ampoules.  [CS]    Clinical Course User Index [CS] Truddie Hidden, MD    Final Clinical  Impression(s) / ED Diagnoses Final diagnoses:  Moderate persistent asthma with exacerbation    Rx / DC Orders ED Discharge Orders         Ordered    predniSONE (DELTASONE) 20 MG tablet  Daily        03/14/20 1517    For home use only DME Nebulizer machine        03/14/20 1517    albuterol (PROVENTIL) (2.5 MG/3ML) 0.083% nebulizer solution  Every 6 hours PRN        03/14/20 1517           Truddie Hidden, MD 03/15/20 [phone]

## 2020-04-03 ENCOUNTER — Encounter: Payer: Self-pay | Admitting: Pulmonary Disease

## 2020-04-03 ENCOUNTER — Ambulatory Visit: Payer: PPO | Admitting: Pulmonary Disease

## 2020-04-03 ENCOUNTER — Other Ambulatory Visit: Payer: Self-pay

## 2020-04-03 VITALS — BP 116/76 | HR 86 | Temp 98.4°F | Ht 64.0 in | Wt 237.2 lb

## 2020-04-03 DIAGNOSIS — J4541 Moderate persistent asthma with (acute) exacerbation: Secondary | ICD-10-CM

## 2020-04-03 DIAGNOSIS — R0683 Snoring: Secondary | ICD-10-CM

## 2020-04-03 NOTE — Patient Instructions (Signed)
Will arrange for home sleep study Will call to arrange for follow up after sleep study reviewed  

## 2020-04-03 NOTE — Progress Notes (Signed)
[facility]  Chief Complaint  Patient presents with  . Follow-up    Had episodes of SOBs, breathing improved at this time     Constitutional:  BP 116/76 (BP Location: Left Arm)   Pulse 86   Temp 98.4 F (36.9 C)   Ht 5\' 4"  (1.626 m)   Wt 237 lb 3.2 oz (107.6 kg)   SpO2 96%   BMI 40.72 kg/m   Past Medical History:  Headache, HTN, Psoriasis  Past Surgical History:  She  has a past surgical history that includes Nasal sinus surgery (2001).  Brief Summary:  Michele Reyes is a 70 y.o. female former smoker with chronic cough from asthma.      Subjective:   I last saw her in 2018.  She has been seen by nurse practitioners more recently.   Was in ER around Christmas with an exacerbation.  Chest xray from 03/14/20 was normal.  She has improved.  Has occasional cough.  Not having wheeze, sputum, or chest pain.  She hasn't needed to use albuterol much.  She has been snoring and wakes up feeling like she can't breath.  Feels sleepy during the day.  Physical Exam:   Appearance - well kempt   ENMT - no sinus tenderness, no oral exudate, no LAN, Mallampati 4 airway, no stridor  Respiratory - equal breath sounds bilaterally, no wheezing or rales  CV - s1s2 regular rate and rhythm, no murmurs  Ext - no clubbing, no edema  Skin - no rashes  Psych - normal mood and affect   Pulmonary testing:   Spirometry 09/29/08 >> FEV1 2.55(100%), FEV1% 72  11/16/08 IgE 45  PFT 02/25/12>>FEV1 2.41 (105%), FEV1% 63, TLC 5.28 (108%), DLCO 75%, no BD  Spirometry 08/10/12 >> FEV1 1.95 (79%), FEV1% 69  PFT 01/02/15 >> FEV1 2.43 (97%), FEV1% 74, TLC 5.02 (99%), DLCO 81%, no BD  PFT 02/10/20 >> FEV1 1.97 (83%), FEV1% 67, TLC 5.44 (107%), DLCO 92%  A1AT 02/10/20 >> 174, MM  IgE 02/10/20 >> 190  Chest Imaging:   CT sinus 01/24/10>>b/l maxillary sinusitis  CT chest 12/27/14 >> mild emphysema, ATX RLL  Sleep Tests:    Social History:   She  reports that she quit smoking about 27 years ago. Her smoking use included cigarettes. She has a 20.00 pack-year smoking history. She has never used smokeless tobacco. She reports current alcohol use. She reports that she does not use drugs.  Family History:  Her family history includes Allergies in her brother and sister; Breast cancer in her sister; Colon cancer in her mother; Heart attack in her father and mother; Heart failure in her father; Hypertension in her father and mother.     Assessment/Plan:   Moderate, persistent asthma. - improved recently - continue symbicort, and singulair - prn albuterol (she has inhaler and nebulizer) - will reassess her status in the Spring; if she has more allergic component, then consider referral to allergist  Snoring. - she has sleep disruption, apnea, and daytime sleepiness - her BMI is > 35 - she has history of hypertension - I am concerned she could have obstructive sleep apnea - will arrange for home sleep study to further assess  Obesity. - she is aware of how her weight could be impacting her respiratory issues  Time Spent Involved in Patient Care on Day of Examination:  32 minutes  Follow up:  Patient Instructions  Will arrange for home sleep study Will  call to arrange for follow up after sleep study reviewed    Medication List:   Allergies as of 04/03/2020      Reactions   Azithromycin Hives, Rash   Levofloxacin Hives      Penicillin G Hives      Penicillins Hives   Sulfa Antibiotics Hives   Sulfamethoxazole-trimethoprim Hives, Rash      Medication List       Accurate as of April 03, 2020 11:04 AM. If you have any questions, ask your nurse or doctor.        STOP taking these medications   predniSONE 20 MG tablet Commonly known as: DELTASONE Stopped by: Chesley Mires, MD     TAKE these medications   AeroChamber MV inhaler Use as instructed   albuterol 108 (90 Base) MCG/ACT inhaler Commonly known  as: ProAir HFA Inhale 2 puffs into the lungs every 6 (six) hours as needed for wheezing.   albuterol (2.5 MG/3ML) 0.083% nebulizer solution Commonly known as: PROVENTIL Take 3 mLs (2.5 mg total) by nebulization every 6 (six) hours as needed for wheezing or shortness of breath.   budesonide-formoterol 160-4.5 MCG/ACT inhaler Commonly known as: SYMBICORT Inhale 2 puffs into the lungs 2 (two) times daily.   DULoxetine 30 MG capsule Commonly known as: CYMBALTA Take 30 mg by mouth daily.   ibuprofen 200 MG tablet Commonly known as: ADVIL Take 400-600 mg by mouth every 6 (six) hours as needed for pain.   KRILL OIL PO Take 1 capsule by mouth daily.   losartan-hydrochlorothiazide 100-12.5 MG tablet Commonly known as: HYZAAR Take 1 tablet by mouth daily.   montelukast 10 MG tablet Commonly known as: Singulair Take 1 tablet (10 mg total) by mouth at bedtime.   naproxen sodium 220 MG tablet Commonly known as: ALEVE Take 220 mg by mouth daily as needed (for pain).   NON FORMULARY Take by mouth See admin instructions. "Nature's Own" shake powder- Mix 1 shake and drink once a day   omeprazole 40 MG capsule Commonly known as: PRILOSEC Take 40 mg by mouth daily with breakfast.   TURMERIC PO Take by mouth See admin instructions. "Golden Milkshake"- Mix powder into soup or a shake and drink once a day   Vitamin D-3 25 MCG (1000 UT) Caps Take 1,000 Units by mouth daily with breakfast.   Womens One Daily Tabs Take 1 tablet by mouth daily.       Signature:  Chesley Mires, MD [place] Pager - [phone] 04/03/2020, 11:04 AM

## 2020-06-11 ENCOUNTER — Telehealth: Payer: Self-pay | Admitting: Pulmonary Disease

## 2020-06-11 NOTE — Telephone Encounter (Signed)
Noted  

## 2020-07-02 DIAGNOSIS — D125 Benign neoplasm of sigmoid colon: Secondary | ICD-10-CM | POA: Diagnosis not present

## 2020-07-02 DIAGNOSIS — K621 Rectal polyp: Secondary | ICD-10-CM | POA: Diagnosis not present

## 2020-07-02 DIAGNOSIS — D123 Benign neoplasm of transverse colon: Secondary | ICD-10-CM | POA: Diagnosis not present

## 2020-07-02 DIAGNOSIS — Z1211 Encounter for screening for malignant neoplasm of colon: Secondary | ICD-10-CM | POA: Diagnosis not present

## 2020-07-02 DIAGNOSIS — D124 Benign neoplasm of descending colon: Secondary | ICD-10-CM | POA: Diagnosis not present

## 2020-07-02 DIAGNOSIS — D127 Benign neoplasm of rectosigmoid junction: Secondary | ICD-10-CM | POA: Diagnosis not present

## 2020-07-02 DIAGNOSIS — Z800 Family history of malignant neoplasm of digestive organs: Secondary | ICD-10-CM | POA: Diagnosis not present

## 2020-07-02 DIAGNOSIS — K635 Polyp of colon: Secondary | ICD-10-CM | POA: Diagnosis not present

## 2020-09-03 DIAGNOSIS — E559 Vitamin D deficiency, unspecified: Secondary | ICD-10-CM | POA: Diagnosis not present

## 2020-09-03 DIAGNOSIS — M79645 Pain in left finger(s): Secondary | ICD-10-CM | POA: Diagnosis not present

## 2020-09-03 DIAGNOSIS — I10 Essential (primary) hypertension: Secondary | ICD-10-CM | POA: Diagnosis not present

## 2020-09-03 DIAGNOSIS — J439 Emphysema, unspecified: Secondary | ICD-10-CM | POA: Diagnosis not present

## 2020-09-03 DIAGNOSIS — E785 Hyperlipidemia, unspecified: Secondary | ICD-10-CM | POA: Diagnosis not present

## 2020-09-03 DIAGNOSIS — F32A Depression, unspecified: Secondary | ICD-10-CM | POA: Diagnosis not present

## 2020-09-03 DIAGNOSIS — K219 Gastro-esophageal reflux disease without esophagitis: Secondary | ICD-10-CM | POA: Diagnosis not present

## 2020-09-27 DIAGNOSIS — U071 COVID-19: Secondary | ICD-10-CM | POA: Diagnosis not present

## 2020-10-24 DIAGNOSIS — M1812 Unilateral primary osteoarthritis of first carpometacarpal joint, left hand: Secondary | ICD-10-CM | POA: Diagnosis not present

## 2020-10-24 DIAGNOSIS — M79645 Pain in left finger(s): Secondary | ICD-10-CM | POA: Diagnosis not present

## 2020-11-05 DIAGNOSIS — L814 Other melanin hyperpigmentation: Secondary | ICD-10-CM | POA: Diagnosis not present

## 2020-11-05 DIAGNOSIS — L821 Other seborrheic keratosis: Secondary | ICD-10-CM | POA: Diagnosis not present

## 2020-11-05 DIAGNOSIS — L720 Epidermal cyst: Secondary | ICD-10-CM | POA: Diagnosis not present

## 2020-11-05 DIAGNOSIS — L738 Other specified follicular disorders: Secondary | ICD-10-CM | POA: Diagnosis not present

## 2020-11-05 DIAGNOSIS — D235 Other benign neoplasm of skin of trunk: Secondary | ICD-10-CM | POA: Diagnosis not present

## 2020-11-28 DIAGNOSIS — M25512 Pain in left shoulder: Secondary | ICD-10-CM | POA: Diagnosis not present

## 2020-11-28 DIAGNOSIS — M25532 Pain in left wrist: Secondary | ICD-10-CM | POA: Diagnosis not present

## 2020-11-28 DIAGNOSIS — M9902 Segmental and somatic dysfunction of thoracic region: Secondary | ICD-10-CM | POA: Diagnosis not present

## 2020-11-28 DIAGNOSIS — M5134 Other intervertebral disc degeneration, thoracic region: Secondary | ICD-10-CM | POA: Diagnosis not present

## 2020-12-04 DIAGNOSIS — M25532 Pain in left wrist: Secondary | ICD-10-CM | POA: Diagnosis not present

## 2020-12-04 DIAGNOSIS — M9902 Segmental and somatic dysfunction of thoracic region: Secondary | ICD-10-CM | POA: Diagnosis not present

## 2020-12-04 DIAGNOSIS — M25512 Pain in left shoulder: Secondary | ICD-10-CM | POA: Diagnosis not present

## 2020-12-04 DIAGNOSIS — M5134 Other intervertebral disc degeneration, thoracic region: Secondary | ICD-10-CM | POA: Diagnosis not present

## 2020-12-11 DIAGNOSIS — M25532 Pain in left wrist: Secondary | ICD-10-CM | POA: Diagnosis not present

## 2020-12-11 DIAGNOSIS — M5134 Other intervertebral disc degeneration, thoracic region: Secondary | ICD-10-CM | POA: Diagnosis not present

## 2020-12-11 DIAGNOSIS — M25512 Pain in left shoulder: Secondary | ICD-10-CM | POA: Diagnosis not present

## 2020-12-11 DIAGNOSIS — M9902 Segmental and somatic dysfunction of thoracic region: Secondary | ICD-10-CM | POA: Diagnosis not present

## 2021-03-11 DIAGNOSIS — Z20822 Contact with and (suspected) exposure to covid-19: Secondary | ICD-10-CM | POA: Diagnosis not present

## 2021-03-11 DIAGNOSIS — K219 Gastro-esophageal reflux disease without esophagitis: Secondary | ICD-10-CM | POA: Diagnosis not present

## 2021-03-11 DIAGNOSIS — R252 Cramp and spasm: Secondary | ICD-10-CM | POA: Diagnosis not present

## 2021-03-11 DIAGNOSIS — R059 Cough, unspecified: Secondary | ICD-10-CM | POA: Diagnosis not present

## 2021-03-11 DIAGNOSIS — Z6841 Body Mass Index (BMI) 40.0 and over, adult: Secondary | ICD-10-CM | POA: Diagnosis not present

## 2021-03-11 DIAGNOSIS — R0981 Nasal congestion: Secondary | ICD-10-CM | POA: Diagnosis not present

## 2021-03-11 DIAGNOSIS — R509 Fever, unspecified: Secondary | ICD-10-CM | POA: Diagnosis not present

## 2021-03-11 DIAGNOSIS — I10 Essential (primary) hypertension: Secondary | ICD-10-CM | POA: Diagnosis not present

## 2021-03-11 DIAGNOSIS — J0140 Acute pansinusitis, unspecified: Secondary | ICD-10-CM | POA: Diagnosis not present

## 2021-03-11 DIAGNOSIS — J439 Emphysema, unspecified: Secondary | ICD-10-CM | POA: Diagnosis not present

## 2021-03-11 DIAGNOSIS — E785 Hyperlipidemia, unspecified: Secondary | ICD-10-CM | POA: Diagnosis not present

## 2021-03-17 ENCOUNTER — Other Ambulatory Visit: Payer: Self-pay

## 2021-03-17 ENCOUNTER — Emergency Department (HOSPITAL_BASED_OUTPATIENT_CLINIC_OR_DEPARTMENT_OTHER)
Admission: EM | Admit: 2021-03-17 | Discharge: 2021-03-17 | Disposition: A | Discharge status: Home / Self Care | Payer: PPO | Attending: Emergency Medicine | Admitting: Emergency Medicine

## 2021-03-17 ENCOUNTER — Encounter (HOSPITAL_BASED_OUTPATIENT_CLINIC_OR_DEPARTMENT_OTHER): Payer: Self-pay

## 2021-03-17 ENCOUNTER — Emergency Department (HOSPITAL_BASED_OUTPATIENT_CLINIC_OR_DEPARTMENT_OTHER): Payer: PPO

## 2021-03-17 DIAGNOSIS — R1030 Lower abdominal pain, unspecified: Secondary | ICD-10-CM | POA: Diagnosis present

## 2021-03-17 DIAGNOSIS — Z79899 Other long term (current) drug therapy: Secondary | ICD-10-CM | POA: Insufficient documentation

## 2021-03-17 DIAGNOSIS — I10 Essential (primary) hypertension: Secondary | ICD-10-CM | POA: Insufficient documentation

## 2021-03-17 DIAGNOSIS — Z7951 Long term (current) use of inhaled steroids: Secondary | ICD-10-CM | POA: Diagnosis not present

## 2021-03-17 DIAGNOSIS — K529 Noninfective gastroenteritis and colitis, unspecified: Secondary | ICD-10-CM

## 2021-03-17 DIAGNOSIS — Z87891 Personal history of nicotine dependence: Secondary | ICD-10-CM | POA: Diagnosis not present

## 2021-03-17 DIAGNOSIS — J4540 Moderate persistent asthma, uncomplicated: Secondary | ICD-10-CM | POA: Insufficient documentation

## 2021-03-17 LAB — COMPREHENSIVE METABOLIC PANEL
ALT: 13 U/L (ref 0–44)
AST: 15 U/L (ref 15–41)
Albumin: 3.9 g/dL (ref 3.5–5.0)
Alkaline Phosphatase: 83 U/L (ref 38–126)
Anion gap: 12 (ref 5–15)
BUN: 20 mg/dL (ref 8–23)
CO2: 24 mmol/L (ref 22–32)
Calcium: 9.2 mg/dL (ref 8.9–10.3)
Chloride: 103 mmol/L (ref 98–111)
Creatinine, Ser: 0.76 mg/dL (ref 0.44–1.00)
GFR, Estimated: >60 mL/min (ref >60)
Glucose, Bld: 84 mg/dL (ref 70–99)
Potassium: 3.5 mmol/L (ref 3.5–5.1)
Sodium: 139 mmol/L (ref 135–145)
Total Bilirubin: 0.7 mg/dL (ref 0.3–1.2)
Total Protein: 7 g/dL (ref 6.5–8.1)

## 2021-03-17 LAB — OCCULT BLOOD X 1 CARD TO LAB, STOOL: Fecal Occult Bld: POSITIVE — AB

## 2021-03-17 LAB — CBC
HCT: 43 % (ref 36.0–46.0)
Hemoglobin: 14.2 g/dL (ref 12.0–15.0)
MCH: 29.3 pg (ref 26.0–34.0)
MCHC: 33 g/dL (ref 30.0–36.0)
MCV: 88.7 fL (ref 80.0–100.0)
Platelets: 307 10*3/uL (ref 150–400)
RBC: 4.85 MIL/uL (ref 3.87–5.11)
RDW: 13 % (ref 11.5–15.5)
WBC: 13.8 10*3/uL — ABNORMAL HIGH (ref 4.0–10.5)
nRBC: 0 % (ref 0.0–0.2)

## 2021-03-17 LAB — LIPASE, BLOOD: Lipase: <10 U/L — ABNORMAL LOW (ref 11–51)

## 2021-03-17 MED ORDER — IOHEXOL 300 MG/ML  SOLN
100.0000 mL | Freq: Once | INTRAMUSCULAR | Status: AC | PRN
  Administered 2021-03-17: 18:00:00 100 mL via INTRAVENOUS

## 2021-03-17 MED ORDER — CEFDINIR 300 MG PO CAPS: 300.0000 mg | ORAL_CAPSULE | Freq: Two times a day (BID) | ORAL | 0 refills | Status: AC

## 2021-03-17 MED ORDER — METRONIDAZOLE 500 MG PO TABS: 500.0000 mg | ORAL_TABLET | Freq: Two times a day (BID) | ORAL | 0 refills | Status: DC

## 2021-03-17 NOTE — Discharge Instructions (Addendum)
You were seen in the emergency department today for blood in your stool.  As we discussed your lab work is overall looked okay.  Your CT scan showed 2 things, a hernia, as well as infectious colitis.  We treat this like diverticulitis, with antibiotics.  You'll take the antibiotics for 7 days, please follow the prescription instructions. I'd like you to continue drinking lots of fluids at home.  In terms of the hernia, I am attaching the contact information for [facility] surgery, and I'd like you to call and schedule an appointment with them to discuss management of your hernia.  Continue to monitor how you're doing and return to the ER for new or worsening symptoms such as fevers, chills, signs of dehydration, or worsening pain.   It has been a pleasure seeing and caring for you today and I hope you start feeling better soon!

## 2021-03-17 NOTE — ED Notes (Signed)
Pt reports having diarrhea Friday lasting through out the night, on Saturday she began feeling like she needed to have a BM but unable to go, eating made it worse, unable to get any sleep. Pt reports last normal stool was Thursday. Approx 9pm last night pt began noticing blood in her stool. Pt reports scant amount of bright red blood on the tissue and some in the toilet not actually in the stool.  Hx of hemorrhoids, has never had a flare up of her hemorrhoids, pt is unsure if she has "busted some blood vessels"

## 2021-03-17 NOTE — ED Notes (Signed)
Patient transported to CT 

## 2021-03-17 NOTE — ED Provider Notes (Signed)
[facility] Provider Note   CSN: [id number] Arrival date & time: 03/17/21  1251     History Chief Complaint  Patient presents with   Hematochezia    Michele Reyes is a 70 y.o. female who presents the emergency department complaining of diarrhea, blood in her stool, and lower abdominal pain.  Patient states she began having diarrhea 2 nights ago, and then yesterday began feeling like she needed to have a bowel movement but was not able to go.  She states that eating made her abdominal pain worse, and she was unable to get any sleep.  She reports her last normal stool was on Thursday.  At about 9 PM last night she noticed bright red blood on the toilet paper when she was wiping, and some in the toilet but not on her stool.  History of hemorrhoids, has never had a flareup of these.  She is unsure if she has "busted some blood vessels".  No fevers, chills, dizziness, or syncope.  HPI     Past Medical History:  Diagnosis Date   Asthma    Headache(784.0)    Hypertension    Psoriasis    Rhinitis     Patient Active Problem List   Diagnosis Date Noted   Emphysema of lung ([place]) 01/05/2015   Chronic cough 01/23/2012   Chronic rhinitis 09/29/2008   Moderate persistent asthma 09/29/2008    Past Surgical History:  Procedure Laterality Date   NASAL SINUS SURGERY  2001     OB History   No obstetric history on file.     Family History  Problem Relation Age of Onset   Allergies Brother        x2   Allergies Sister    Hypertension Mother    Heart attack Mother    Colon cancer Mother    Hypertension Father    Heart attack Father    Heart failure Father    Breast cancer Sister     Social History   Tobacco Use   Smoking status: Former    Packs/day: 2.00    Years: 10.00    Pack years: 20.00    Types: Cigarettes    Quit date: 03/24/1993    Years since quitting: 28.0   Smokeless tobacco: Never  Substance Use Topics   Alcohol use: Yes     Comment: occasionally   Drug use: No    Home Medications Prior to Admission medications   Medication Sig Start Date End Date Taking? Authorizing Provider  cefdinir (OMNICEF) 300 MG capsule Take 1 capsule (300 mg total) by mouth 2 (two) times daily for 7 days. 03/17/21 03/24/21 Yes Finbar Nippert T, PA-C  metroNIDAZOLE (FLAGYL) 500 MG tablet Take 1 tablet (500 mg total) by mouth 2 (two) times daily. 03/17/21  Yes Daymien Goth T, PA-C  albuterol (PROAIR HFA) 108 (90 BASE) MCG/ACT inhaler Inhale 2 puffs into the lungs every 6 (six) hours as needed for wheezing. 03/06/15   Chesley Mires, MD  albuterol (PROVENTIL) (2.5 MG/3ML) 0.083% nebulizer solution Take 3 mLs (2.5 mg total) by nebulization every 6 (six) hours as needed for wheezing or shortness of breath. 03/14/20   Truddie Hidden, MD  budesonide-formoterol [facility]) 160-4.5 MCG/ACT inhaler Inhale 2 puffs into the lungs 2 (two) times daily. 02/10/20   Martyn Ehrich, NP  Cholecalciferol (VITAMIN D-3) 25 MCG (1000 UT) CAPS Take 1,000 Units by mouth daily with breakfast.    [provider]  DULoxetine (CYMBALTA) 30  MG capsule Take 30 mg by mouth daily. 08/02/19   [provider]  ibuprofen (ADVIL,MOTRIN) 200 MG tablet Take 400-600 mg by mouth every 6 (six) hours as needed for pain.    [provider]  KRILL OIL PO Take 1 capsule by mouth daily.    [provider]  losartan-hydrochlorothiazide (HYZAAR) 100-12.5 MG tablet Take 1 tablet by mouth daily.    [provider]  montelukast (SINGULAIR) 10 MG tablet Take 1 tablet (10 mg total) by mouth at bedtime. 02/10/20   Martyn Ehrich, NP  Multiple Vitamins-Minerals (WOMENS ONE DAILY) TABS Take 1 tablet by mouth daily.    [provider]  naproxen sodium (ALEVE) 220 MG tablet Take 220 mg by mouth daily as needed (for pain).    [provider]  NON FORMULARY Take by mouth See admin instructions. "Nature's Own" shake powder- Mix  1 shake and drink once a day    [provider]  omeprazole (PRILOSEC) 40 MG capsule Take 40 mg by mouth daily with breakfast. 10/25/19   [provider]  Spacer/Aero-Holding Chambers (AEROCHAMBER MV) inhaler Use as instructed 01/14/13   Chesley Mires, MD  TURMERIC PO Take by mouth See admin instructions. "Golden Milkshake"- Mix powder into soup or a shake and drink once a day    [provider]    Allergies    Azithromycin, Levofloxacin, Penicillin g, Penicillins, Sulfa antibiotics, and Sulfamethoxazole-trimethoprim  Review of Systems   Review of Systems  Constitutional:  Negative for chills and fever.  HENT:  Negative for congestion.   Respiratory:  Negative for cough and shortness of breath.   Cardiovascular:  Negative for chest pain.  Gastrointestinal:  Positive for abdominal pain, blood in stool and diarrhea. Negative for nausea and vomiting.  Genitourinary:  Negative for dysuria, flank pain and hematuria.  Musculoskeletal:  Negative for back pain.  All other systems reviewed and are negative.  Physical Exam Updated Vital Signs BP (!) 140/92 (BP Location: Left Arm)    Pulse (!) 101    Temp 99 F (37.2 C)    Resp 16    Ht 5' 3.75" (1.619 m)    Wt 110.2 kg    SpO2 96%    BMI 42.04 kg/m   Physical Exam Vitals and nursing note reviewed. Exam conducted with a chaperone present.  Constitutional:      Appearance: Normal appearance.  HENT:     Head: Normocephalic and atraumatic.  Eyes:     Conjunctiva/sclera: Conjunctivae normal.  Cardiovascular:     Rate and Rhythm: Normal rate and regular rhythm.  Pulmonary:     Effort: Pulmonary effort is normal. No respiratory distress.     Breath sounds: Normal breath sounds.  Abdominal:     General: Abdomen is flat. There is no distension.     Palpations: Abdomen is soft.     Tenderness: There is abdominal tenderness in the periumbilical area. There is no right CVA tenderness, left CVA tenderness, guarding or  rebound.     Hernia: A hernia is present. Hernia is present in the ventral area.  Genitourinary:    Rectum: Guaiac result positive. External hemorrhoid present. No tenderness.  Skin:    General: Skin is warm and dry.  Neurological:     General: No focal deficit present.     Mental Status: She is alert.    ED Results / Procedures / Treatments   Labs (all labs ordered are listed, but only abnormal results are displayed)  Labs Reviewed  LIPASE, BLOOD - Abnormal; Notable for the following components:      Result Value   Lipase <10 (*)    All other components within normal limits  CBC - Abnormal; Notable for the following components:   WBC 13.8 (*)    All other components within normal limits  OCCULT BLOOD X 1 CARD TO LAB, STOOL - Abnormal; Notable for the following components:   Fecal Occult Bld POSITIVE (*)    All other components within normal limits  COMPREHENSIVE METABOLIC PANEL  URINALYSIS, ROUTINE W REFLEX MICROSCOPIC    EKG None  Radiology CT ABDOMEN PELVIS W CONTRAST  Addendum Date: 03/17/2021   ADDENDUM REPORT: 03/17/2021 17:58 ADDENDUM: Not included in the original report, the patient is noted to have a supraumbilical midline ventral hernia about 3 cm cranial to the umbilicus containing a short segment of small bowel without complicating features. Just to the left of this hernia is another tiny fascial defect containing only fat. Associated small umbilical fat containing hernia also noted. Electronically Signed   By: Misty Stanley M.D.   On: 03/17/2021 17:58   Result Date: 03/17/2021 CLINICAL DATA:  Abdominal pain.  Rectal bleeding. EXAM: CT ABDOMEN AND PELVIS WITH CONTRAST TECHNIQUE: Multidetector CT imaging of the abdomen and pelvis was performed using the standard protocol following bolus administration of intravenous contrast. CONTRAST:  14mL OMNIPAQUE IOHEXOL 300 MG/ML  SOLN COMPARISON:  None. FINDINGS: Lower chest: Unremarkable. Hepatobiliary: 14 mm cyst identified  medial segment left liver. Gallbladder is distended. No intrahepatic or extrahepatic biliary dilation. Pancreas: No focal mass lesion. No dilatation of the main duct. No intraparenchymal cyst. No peripancreatic edema. Spleen: No splenomegaly. No focal mass lesion. Adrenals/Urinary Tract: No adrenal nodule or mass. Right kidney unremarkable. Central sinus cysts noted in the left kidney. No evidence for hydroureter. The urinary bladder appears normal for the degree of distention. Stomach/Bowel: Stomach is unremarkable. No gastric wall thickening. No evidence of outlet obstruction. Duodenum is normally positioned as is the ligament of Treitz. No small bowel wall thickening. No small bowel dilatation. The terminal ileum is normal. The appendix is normal. There is diffuse diverticular disease. Sigmoid colon and rectum show circumferential wall thickening with pericolonic edema/inflammation. Segment of colon involved is longer than typically seen in the setting of diverticulitis and while this is a consideration, infectious/inflammatory colitis is favored by imaging. Vascular/Lymphatic: There is mild atherosclerotic calcification of the abdominal aorta without aneurysm. There is no gastrohepatic or hepatoduodenal ligament lymphadenopathy. No retroperitoneal or mesenteric lymphadenopathy. No pelvic sidewall lymphadenopathy. Reproductive: 2.2 cm exophytic fibroid noted uterine fundus. There is no adnexal mass. Other: No intraperitoneal free fluid. Musculoskeletal: No worrisome lytic or sclerotic osseous abnormality. IMPRESSION: 1. Circumferential wall thickening in the sigmoid colon and rectum with pericolonic edema/inflammation. While there is left-sided diverticulosis, segment of colon involved is longer than typically seen in the setting of diverticulitis and left sided infectious/inflammatory colitis is favored by imaging. 2. Diffuse diverticular disease. 3. Aortic Atherosclerosis (ICD10-I70.0). Electronically Signed:  By: Misty Stanley M.D. On: 03/17/2021 17:48    Procedures Procedures   Medications Ordered in ED Medications  iohexol (OMNIPAQUE) 300 MG/ML solution 100 mL (100 mLs Intravenous Contrast Given 03/17/21 1730)    ED Course  I have reviewed the triage vital signs and the nursing notes.  Pertinent labs & imaging results that were available during my care of the patient were reviewed by me and considered in my medical decision making (see chart for details).  MDM Rules/Calculators/A&P                          Patient is a 70 year old female with history of asthma, hypertension, and emphysema who presents to the emergency department complaining of rectal bleeding and diarrhea.  On my exam patient is afebrile, not tachycardic, not hypoxic, and in no acute distress.  She has significant abdominal tenderness to palpation in her umbilical region, and there appears to be a hernia when I palpate, nothing seen on the skin. Hemoccult collected which is positive.  The differential diagnosis for lower GI bleed includes but is not limited to high flow upper GI bleed, diverticulosis or radiculitis, vascular ectasia/arteriovenous malformation, inflammatory bowel disease, infectious colitis, mesenteric ischemia or ischemic colitis, Meckel's diverticulum, colorectal cancer or polyps, internal hemorrhoids, aortoenteric fistula, rectal foreign body, rectal ulceration or anal fissure.   Lab work overall unremarkable.  CT scan showed ventral hernia with bowel contents, no evidence of strangulation.  Also commented on pattern most consistent with inflammatory/infectious colitis, no evidence of ischemia.  Overall patient is clinically well-appearing, and I do not believe she is requiring admission or inpatient treatment.  Hemoglobin was stable, she is not requiring blood transfusion.  Will treat outpatient with antibiotics, pharmacy consulted due to patient's numerous allergies.  Patient given contact information for  outpatient surgery, to follow-up about the hernia.  Given close return precautions.  Patient agreeable to plan.  I discussed this case with my attending physician Dr. Sherry Ruffing who cosigned this note including patient's presenting symptoms, physical exam, and planned diagnostics and interventions. Attending physician stated agreement with plan or made changes to plan which were implemented.    Final Clinical Impression(s) / ED Diagnoses Final diagnoses:  Colitis    Rx / DC Orders ED Discharge Orders          Ordered    metroNIDAZOLE (FLAGYL) 500 MG tablet  2 times daily        03/17/21 2055    cefdinir (OMNICEF) 300 MG capsule  2 times daily        03/17/21 2055           Portions of this report may have been transcribed using voice recognition software. Every effort was made to ensure accuracy; however, inadvertent computerized transcription errors may be present.    Estill Cotta 03/17/21 2151    Tegeler, Gwenyth Allegra, MD 03/18/21 Benancio Deeds

## 2021-03-17 NOTE — ED Notes (Signed)
Pt unable to urinate at this time.  

## 2021-03-17 NOTE — ED Notes (Addendum)
Reassessed pt's O2. Pt at 96% room air. Abe People- Triage RN aware.

## 2021-03-17 NOTE — ED Triage Notes (Signed)
Pt c/o bright red blood passing during her BM approximately ten hours ago. Pt c/o lower abdominal pain. No dizziness/syncope.

## 2021-03-29 ENCOUNTER — Ambulatory Visit: Payer: Self-pay | Admitting: Surgery

## 2021-03-29 NOTE — H&P (View-Only) (Signed)
Chief Complaint: Supraumbilical hernia   PCP - Bing Matter, PA-C Pul[facility] - [facility]     History of Present Illness: Michele Reyes is a 71 y.o. female who is seen today as an office consultation at the request of Dr. Wolfgang Phoenix for evaluation of Supraumbilical hernia .     This is a 34 old female with some medical issues including chronic bronchitis and mild COPD who had a recent ER visit on 03/17/2021 for several days left lower quadrant abdominal pain and blood in her bowel movements.  She was diagnosed with colitis and was treated with antibiotics.  Those symptoms have resolved.  She had a colonoscopy 9 months ago reportedly showed only some mild diverticulosis.  When the patient was in the ER, she had a CT scan that incidentally showed a supraumbilical ventral hernia that contained a small amount of incarcerated small bowel.  The patient presents now to discuss repair of this ventral hernia.  Prior to the ER visit, she had no symptoms associated with that.  In retrospect, she thinks that she may have felt some discomfort in this area about a year ago but has not really had any symptoms since that time.   The patient does have a chronic cough and is on medication for her chronic lung disease.     Review of Systems: A complete review of systems was obtained from the patient.  I have reviewed this information and discussed as appropriate with the patient.  See HPI as well for other ROS.   Review of Systems  Constitutional: Negative.   HENT: Positive for congestion, hearing loss and tinnitus.   Eyes: Negative.   Respiratory: Positive for cough and sputum production.   Cardiovascular: Negative.   Gastrointestinal: Positive for abdominal pain.  Genitourinary: Negative.   Musculoskeletal: Negative.   Skin: Negative.   Neurological: Negative.   Endo/Heme/Allergies: Bruises/bleeds easily.  Psychiatric/Behavioral: Positive for depression and memory loss.        Medical  History: Past Medical History      Past Medical History:  Diagnosis Date   Arthritis     Asthma, unspecified asthma severity, unspecified whether complicated, unspecified whether persistent     Hyperlipidemia     Hypertension             Patient Active Problem List  Diagnosis   Depression   Emphysema of lung (CMS-HCC)   Hypertension   Asthmatic bronchitis with acute exacerbation      Past Surgical History       Past Surgical History:  Procedure Laterality Date   EXCISION PILONIDAL CYST/SINUS            Allergies       Allergies  Allergen Reactions   Azithromycin Hives and Rash   Levofloxacin Hives      other     Penicillins Hives      other     Sulfamethoxazole-Trimethoprim Hives and Rash   Sulfa (Sulfonamide Antibiotics) Hives              Current Outpatient Medications on File Prior to Visit  Medication Sig Dispense Refill   albuterol (PROVENTIL) 2.5 mg /3 mL (0.083 %) nebulizer solution Inhale 2.5 mg into the lungs every 6 (six) hours as needed       budesonide-formoteroL (SYMBICORT) 160-4.5 mcg/actuation inhaler Inhale 2 inhalations into the lungs 2 (two) times daily       losartan-hydrochlorothiazide (HYZAAR) 100-12.5 mg tablet Take 1 tablet by mouth once daily  metroNIDAZOLE (FLAGYL) 500 MG tablet Take 500 mg by mouth 2 (two) times daily       montelukast (SINGULAIR) 10 mg tablet Take one pill per day       omeprazole (PRILOSEC) 40 MG DR capsule Take by mouth       ibuprofen (MOTRIN) 200 MG tablet Take by mouth       omega-3 fatty acids-fish oil 300-1,000 mg capsule Take by mouth       turmeric, bulk, 100 % Powd Take by mouth        No current facility-administered medications on file prior to visit.      Family History       Family History  Problem Relation Age of Onset   Skin cancer Mother     Obesity Mother     High blood pressure (Hypertension) Mother     Hyperlipidemia (Elevated cholesterol) Mother     Colon cancer Mother      Coronary Artery Disease (Blocked arteries around heart) Mother     Obesity Father     High blood pressure (Hypertension) Father     Hyperlipidemia (Elevated cholesterol) Father     Obesity Sister     High blood pressure (Hypertension) Sister     Hyperlipidemia (Elevated cholesterol) Sister     Diabetes Sister     Breast cancer Sister          Social History        Tobacco Use  Smoking Status Former   Types: Cigarettes  Smokeless Tobacco Never      Social History  Social History         Socioeconomic History   Marital status: Single  Tobacco Use   Smoking status: Former      Types: Cigarettes   Smokeless tobacco: Never  Substance and Sexual Activity   Alcohol use: Yes   Drug use: Never        Objective:         Vitals:    03/29/21 0936  BP: (!) 142/96  Pulse: 110  Temp: 36.7 C (98 F)  SpO2: 96%  Weight: (!) 108.7 kg (239 lb 9.6 oz)  Height: 161.3 cm (5' 3.5")    Body mass index is 41.78 kg/m.   Physical Exam    Constitutional:  WDWN in NAD, conversant, no obvious deformities; lying in bed comfortably Eyes:  Pupils equal, round; sclera anicteric; moist conjunctiva; no lid lag HENT:  Oral mucosa moist; good dentition  Neck:  No masses palpated, trachea midline; no thyromegaly Lungs:  CTA bilaterally; normal respiratory effort CV:  Regular rate and rhythm; no murmurs; extremities well-perfused with no edema Abd:  +bowel sounds, soft, obese non-tender, no palpable organomegaly; palpable reducible ventral hernia about 3 cm above the umbilicus. Musc:  Normal gait; no apparent clubbing or cyanosis in extremities Lymphatic:  No palpable cervical or axillary lymphadenopathy Skin:  Warm, dry; no sign of jaundice Psychiatric - alert and oriented x 4; calm mood and affect     Labs, Imaging and Diagnostic Testing: CLINICAL DATA:  Abdominal pain.  Rectal bleeding.   EXAM: CT ABDOMEN AND PELVIS WITH CONTRAST   TECHNIQUE: Multidetector CT imaging of the  abdomen and pelvis was performed using the standard protocol following bolus administration of intravenous contrast.   CONTRAST:  130mL OMNIPAQUE IOHEXOL 300 MG/ML  SOLN   COMPARISON:  None.   FINDINGS: Lower chest: Unremarkable.   Hepatobiliary: 14 mm cyst identified medial segment left liver. Gallbladder is distended.  No intrahepatic or extrahepatic biliary dilation.   Pancreas: No focal mass lesion. No dilatation of the main duct. No intraparenchymal cyst. No peripancreatic edema.   Spleen: No splenomegaly. No focal mass lesion.   Adrenals/Urinary Tract: No adrenal nodule or mass. Right kidney unremarkable. Central sinus cysts noted in the left kidney. No evidence for hydroureter. The urinary bladder appears normal for the degree of distention.   Stomach/Bowel: Stomach is unremarkable. No gastric wall thickening. No evidence of outlet obstruction. Duodenum is normally positioned as is the ligament of Treitz. No small bowel wall thickening. No small bowel dilatation. The terminal ileum is normal. The appendix is normal. There is diffuse diverticular disease. Sigmoid colon and rectum show circumferential wall thickening with pericolonic edema/inflammation. Segment of colon involved is longer than typically seen in the setting of diverticulitis and while this is a consideration, infectious/inflammatory colitis is favored by imaging.   Vascular/Lymphatic: There is mild atherosclerotic calcification of the abdominal aorta without aneurysm. There is no gastrohepatic or hepatoduodenal ligament lymphadenopathy. No retroperitoneal or mesenteric lymphadenopathy. No pelvic sidewall lymphadenopathy.   Reproductive: 2.2 cm exophytic fibroid noted uterine fundus. There is no adnexal mass.   Other: No intraperitoneal free fluid.   Musculoskeletal: No worrisome lytic or sclerotic osseous abnormality.   IMPRESSION: 1. Circumferential wall thickening in the sigmoid colon and  rectum with pericolonic edema/inflammation. While there is left-sided diverticulosis, segment of colon involved is longer than typically seen in the setting of diverticulitis and left sided infectious/inflammatory colitis is favored by imaging. 2. Diffuse diverticular disease. 3. Aortic Atherosclerosis (ICD10-I70.0).   Electronically Signed: By: Misty Stanley M.D. On: 03/17/2021 17:48     ADDENDUM: Not included in the original report, the patient is noted to have a supraumbilical midline ventral hernia about 3 cm cranial to the umbilicus containing a short segment of small bowel without complicating features. Just to the left of this hernia is another tiny fascial defect containing only fat. Associated small umbilical fat containing hernia also noted.     Electronically Signed   By: Misty Stanley M.D.   On: 03/17/2021 17:58   Assessment and Plan:  Diagnoses and all orders for this visit:   Ventral hernia without obstruction or gangrene   On the CT scan, the hernia defect measures about 3.5 cm in diameter.  The hernia bulge measures about 6 cm across.  Today the hernia is reducible.   Recommend ventral hernia repair with mesh.The surgical procedure has been discussed with the patient.  Potential risks, benefits, alternative treatments, and expected outcomes have been explained.  All of the patient's questions at this time have been answered.  The likelihood of reaching the patient's treatment goal is good.  The patient understand the proposed surgical procedure and wishes to proceed.   We will try to obtain a copy of her colonoscopy report to confirm the findings of her procedure last year.   No follow-ups on file.   Shaletha Humble Jearld Adjutant, MD  03/29/2021 10:18 AM

## 2021-03-29 NOTE — H&P (Signed)
Chief Complaint: Supraumbilical hernia   PCP - Bing Matter, PA-C [facility] - [facility]     History of Present Illness: Michele Reyes is a 71 y.o. female who is seen today as an office consultation at the request of Dr. Wolfgang Phoenix for evaluation of Supraumbilical hernia .     This is a 76 old female with some medical issues including chronic bronchitis and mild COPD who had a recent ER visit on 03/17/2021 for several days left lower quadrant abdominal pain and blood in her bowel movements.  She was diagnosed with colitis and was treated with antibiotics.  Those symptoms have resolved.  She had a colonoscopy 9 months ago reportedly showed only some mild diverticulosis.  When the patient was in the ER, she had a CT scan that incidentally showed a supraumbilical ventral hernia that contained a small amount of incarcerated small bowel.  The patient presents now to discuss repair of this ventral hernia.  Prior to the ER visit, she had no symptoms associated with that.  In retrospect, she thinks that she may have felt some discomfort in this area about a year ago but has not really had any symptoms since that time.   The patient does have a chronic cough and is on medication for her chronic lung disease.     Review of Systems: A complete review of systems was obtained from the patient.  I have reviewed this information and discussed as appropriate with the patient.  See HPI as well for other ROS.   Review of Systems  Constitutional: Negative.   HENT: Positive for congestion, hearing loss and tinnitus.   Eyes: Negative.   Respiratory: Positive for cough and sputum production.   Cardiovascular: Negative.   Gastrointestinal: Positive for abdominal pain.  Genitourinary: Negative.   Musculoskeletal: Negative.   Skin: Negative.   Neurological: Negative.   Endo/Heme/Allergies: Bruises/bleeds easily.  Psychiatric/Behavioral: Positive for depression and memory loss.        Medical  History: Past Medical History      Past Medical History:  Diagnosis Date   Arthritis     Asthma, unspecified asthma severity, unspecified whether complicated, unspecified whether persistent     Hyperlipidemia     Hypertension             Patient Active Problem List  Diagnosis   Depression   Emphysema of lung (CMS-HCC)   Hypertension   Asthmatic bronchitis with acute exacerbation      Past Surgical History       Past Surgical History:  Procedure Laterality Date   EXCISION PILONIDAL CYST/SINUS            Allergies       Allergies  Allergen Reactions   Azithromycin Hives and Rash   Levofloxacin Hives      other     Penicillins Hives      other     Sulfamethoxazole-Trimethoprim Hives and Rash   Sulfa (Sulfonamide Antibiotics) Hives              Current Outpatient Medications on File Prior to Visit  Medication Sig Dispense Refill   albuterol (PROVENTIL) 2.5 mg /3 mL (0.083 %) nebulizer solution Inhale 2.5 mg into the lungs every 6 (six) hours as needed       budesonide-formoteroL (SYMBICORT) 160-4.5 mcg/actuation inhaler Inhale 2 inhalations into the lungs 2 (two) times daily       losartan-hydrochlorothiazide (HYZAAR) 100-12.5 mg tablet Take 1 tablet by mouth once daily  metroNIDAZOLE (FLAGYL) 500 MG tablet Take 500 mg by mouth 2 (two) times daily       montelukast (SINGULAIR) 10 mg tablet Take one pill per day       omeprazole (PRILOSEC) 40 MG DR capsule Take by mouth       ibuprofen (MOTRIN) 200 MG tablet Take by mouth       omega-3 fatty acids-fish oil 300-1,000 mg capsule Take by mouth       turmeric, bulk, 100 % Powd Take by mouth        No current facility-administered medications on file prior to visit.      Family History       Family History  Problem Relation Age of Onset   Skin cancer Mother     Obesity Mother     High blood pressure (Hypertension) Mother     Hyperlipidemia (Elevated cholesterol) Mother     Colon cancer Mother      Coronary Artery Disease (Blocked arteries around heart) Mother     Obesity Father     High blood pressure (Hypertension) Father     Hyperlipidemia (Elevated cholesterol) Father     Obesity Sister     High blood pressure (Hypertension) Sister     Hyperlipidemia (Elevated cholesterol) Sister     Diabetes Sister     Breast cancer Sister          Social History        Tobacco Use  Smoking Status Former   Types: Cigarettes  Smokeless Tobacco Never      Social History  Social History         Socioeconomic History   Marital status: Single  Tobacco Use   Smoking status: Former      Types: Cigarettes   Smokeless tobacco: Never  Substance and Sexual Activity   Alcohol use: Yes   Drug use: Never        Objective:         Vitals:    03/29/21 0936  BP: (!) 142/96  Pulse: 110  Temp: 36.7 C (98 F)  SpO2: 96%  Weight: (!) 108.7 kg (239 lb 9.6 oz)  Height: 161.3 cm (5' 3.5")    Body mass index is 41.78 kg/m.   Physical Exam    Constitutional:  WDWN in NAD, conversant, no obvious deformities; lying in bed comfortably Eyes:  Pupils equal, round; sclera anicteric; moist conjunctiva; no lid lag HENT:  Oral mucosa moist; good dentition  Neck:  No masses palpated, trachea midline; no thyromegaly Lungs:  CTA bilaterally; normal respiratory effort CV:  Regular rate and rhythm; no murmurs; extremities well-perfused with no edema Abd:  +bowel sounds, soft, obese non-tender, no palpable organomegaly; palpable reducible ventral hernia about 3 cm above the umbilicus. Musc:  Normal gait; no apparent clubbing or cyanosis in extremities Lymphatic:  No palpable cervical or axillary lymphadenopathy Skin:  Warm, dry; no sign of jaundice Psychiatric - alert and oriented x 4; calm mood and affect     Labs, Imaging and Diagnostic Testing: CLINICAL DATA:  Abdominal pain.  Rectal bleeding.   EXAM: CT ABDOMEN AND PELVIS WITH CONTRAST   TECHNIQUE: Multidetector CT imaging of the  abdomen and pelvis was performed using the standard protocol following bolus administration of intravenous contrast.   CONTRAST:  129mL OMNIPAQUE IOHEXOL 300 MG/ML  SOLN   COMPARISON:  None.   FINDINGS: Lower chest: Unremarkable.   Hepatobiliary: 14 mm cyst identified medial segment left liver. Gallbladder is distended.  No intrahepatic or extrahepatic biliary dilation.   Pancreas: No focal mass lesion. No dilatation of the main duct. No intraparenchymal cyst. No peripancreatic edema.   Spleen: No splenomegaly. No focal mass lesion.   Adrenals/Urinary Tract: No adrenal nodule or mass. Right kidney unremarkable. Central sinus cysts noted in the left kidney. No evidence for hydroureter. The urinary bladder appears normal for the degree of distention.   Stomach/Bowel: Stomach is unremarkable. No gastric wall thickening. No evidence of outlet obstruction. Duodenum is normally positioned as is the ligament of Treitz. No small bowel wall thickening. No small bowel dilatation. The terminal ileum is normal. The appendix is normal. There is diffuse diverticular disease. Sigmoid colon and rectum show circumferential wall thickening with pericolonic edema/inflammation. Segment of colon involved is longer than typically seen in the setting of diverticulitis and while this is a consideration, infectious/inflammatory colitis is favored by imaging.   Vascular/Lymphatic: There is mild atherosclerotic calcification of the abdominal aorta without aneurysm. There is no gastrohepatic or hepatoduodenal ligament lymphadenopathy. No retroperitoneal or mesenteric lymphadenopathy. No pelvic sidewall lymphadenopathy.   Reproductive: 2.2 cm exophytic fibroid noted uterine fundus. There is no adnexal mass.   Other: No intraperitoneal free fluid.   Musculoskeletal: No worrisome lytic or sclerotic osseous abnormality.   IMPRESSION: 1. Circumferential wall thickening in the sigmoid colon and  rectum with pericolonic edema/inflammation. While there is left-sided diverticulosis, segment of colon involved is longer than typically seen in the setting of diverticulitis and left sided infectious/inflammatory colitis is favored by imaging. 2. Diffuse diverticular disease. 3. Aortic Atherosclerosis (ICD10-I70.0).   Electronically Signed: By: Misty Stanley M.D. On: 03/17/2021 17:48     ADDENDUM: Not included in the original report, the patient is noted to have a supraumbilical midline ventral hernia about 3 cm cranial to the umbilicus containing a short segment of small bowel without complicating features. Just to the left of this hernia is another tiny fascial defect containing only fat. Associated small umbilical fat containing hernia also noted.     Electronically Signed   By: Misty Stanley M.D.   On: 03/17/2021 17:58   Assessment and Plan:  Diagnoses and all orders for this visit:   Ventral hernia without obstruction or gangrene   On the CT scan, the hernia defect measures about 3.5 cm in diameter.  The hernia bulge measures about 6 cm across.  Today the hernia is reducible.   Recommend ventral hernia repair with mesh.The surgical procedure has been discussed with the patient.  Potential risks, benefits, alternative treatments, and expected outcomes have been explained.  All of the patient's questions at this time have been answered.  The likelihood of reaching the patient's treatment goal is good.  The patient understand the proposed surgical procedure and wishes to proceed.   We will try to obtain a copy of her colonoscopy report to confirm the findings of her procedure last year.   No follow-ups on file.   Duffy Dantonio Jearld Adjutant, MD  03/29/2021 10:18 AM

## 2021-04-10 ENCOUNTER — Other Ambulatory Visit: Payer: Self-pay

## 2021-04-10 ENCOUNTER — Encounter (HOSPITAL_BASED_OUTPATIENT_CLINIC_OR_DEPARTMENT_OTHER): Payer: Self-pay | Admitting: Surgery

## 2021-04-16 ENCOUNTER — Encounter (HOSPITAL_BASED_OUTPATIENT_CLINIC_OR_DEPARTMENT_OTHER)
Admission: RE | Admit: 2021-04-16 | Discharge: 2021-04-16 | Disposition: A | Discharge status: Home / Self Care | Payer: PPO | Source: Ambulatory Visit | Attending: Surgery | Admitting: Surgery

## 2021-04-16 DIAGNOSIS — Z01812 Encounter for preprocedural laboratory examination: Secondary | ICD-10-CM | POA: Diagnosis present

## 2021-04-16 NOTE — Progress Notes (Signed)

## 2021-04-18 ENCOUNTER — Encounter (HOSPITAL_BASED_OUTPATIENT_CLINIC_OR_DEPARTMENT_OTHER)
Admission: RE | Disposition: A | Discharge status: Home / Self Care | Payer: Self-pay | Source: Home / Self Care | Attending: Surgery

## 2021-04-18 ENCOUNTER — Encounter (HOSPITAL_BASED_OUTPATIENT_CLINIC_OR_DEPARTMENT_OTHER): Payer: Self-pay | Admitting: Surgery

## 2021-04-18 ENCOUNTER — Other Ambulatory Visit: Payer: Self-pay

## 2021-04-18 ENCOUNTER — Ambulatory Visit (HOSPITAL_BASED_OUTPATIENT_CLINIC_OR_DEPARTMENT_OTHER)
Admission: RE | Admit: 2021-04-18 | Discharge: 2021-04-18 | Disposition: A | Discharge status: Home / Self Care | Payer: PPO | Attending: Surgery | Admitting: Surgery

## 2021-04-18 ENCOUNTER — Ambulatory Visit (HOSPITAL_BASED_OUTPATIENT_CLINIC_OR_DEPARTMENT_OTHER): Payer: PPO | Admitting: Anesthesiology

## 2021-04-18 DIAGNOSIS — I10 Essential (primary) hypertension: Secondary | ICD-10-CM | POA: Diagnosis not present

## 2021-04-18 DIAGNOSIS — E785 Hyperlipidemia, unspecified: Secondary | ICD-10-CM | POA: Insufficient documentation

## 2021-04-18 DIAGNOSIS — F32A Depression, unspecified: Secondary | ICD-10-CM | POA: Diagnosis not present

## 2021-04-18 DIAGNOSIS — Z6841 Body Mass Index (BMI) 40.0 and over, adult: Secondary | ICD-10-CM | POA: Diagnosis not present

## 2021-04-18 DIAGNOSIS — Z87891 Personal history of nicotine dependence: Secondary | ICD-10-CM | POA: Insufficient documentation

## 2021-04-18 DIAGNOSIS — K219 Gastro-esophageal reflux disease without esophagitis: Secondary | ICD-10-CM | POA: Diagnosis not present

## 2021-04-18 DIAGNOSIS — Z79899 Other long term (current) drug therapy: Secondary | ICD-10-CM | POA: Diagnosis not present

## 2021-04-18 DIAGNOSIS — J449 Chronic obstructive pulmonary disease, unspecified: Secondary | ICD-10-CM | POA: Diagnosis not present

## 2021-04-18 DIAGNOSIS — Z7951 Long term (current) use of inhaled steroids: Secondary | ICD-10-CM | POA: Diagnosis not present

## 2021-04-18 DIAGNOSIS — E669 Obesity, unspecified: Secondary | ICD-10-CM | POA: Insufficient documentation

## 2021-04-18 DIAGNOSIS — K436 Other and unspecified ventral hernia with obstruction, without gangrene: Secondary | ICD-10-CM | POA: Diagnosis present

## 2021-04-18 HISTORY — DX: Gastro-esophageal reflux disease without esophagitis: K21.9

## 2021-04-18 HISTORY — PX: VENTRAL HERNIA REPAIR: SHX424

## 2021-04-18 HISTORY — DX: Chronic obstructive pulmonary disease, unspecified: J44.9

## 2021-04-18 SURGERY — REPAIR, HERNIA, VENTRAL
Anesthesia: General | Site: Abdomen

## 2021-04-18 MED ORDER — CHLORHEXIDINE GLUCONATE CLOTH 2 % EX PADS: 6.0000 | MEDICATED_PAD | Freq: Once | CUTANEOUS | Status: DC

## 2021-04-18 MED ORDER — MIDAZOLAM HCL 2 MG/2ML IJ SOLN
INTRAMUSCULAR | Status: AC
  Filled 2021-04-18: qty 2

## 2021-04-18 MED ORDER — ONDANSETRON HCL 4 MG/2ML IJ SOLN
INTRAMUSCULAR | Status: AC
  Filled 2021-04-18: qty 2

## 2021-04-18 MED ORDER — BUPIVACAINE-EPINEPHRINE 0.25% -1:200000 IJ SOLN
INTRAMUSCULAR | Status: DC | PRN
  Administered 2021-04-18: 10 mL

## 2021-04-18 MED ORDER — ACETAMINOPHEN 500 MG PO TABS
1000.0000 mg | ORAL_TABLET | ORAL | Status: AC
  Administered 2021-04-18: 1000 mg via ORAL

## 2021-04-18 MED ORDER — ONDANSETRON HCL 4 MG/2ML IJ SOLN
INTRAMUSCULAR | Status: DC | PRN
Start: 2021-04-18 — End: 2021-04-18
  Administered 2021-04-18: 4 mg via INTRAVENOUS

## 2021-04-18 MED ORDER — FENTANYL CITRATE (PF) 100 MCG/2ML IJ SOLN
INTRAMUSCULAR | Status: AC
  Filled 2021-04-18: qty 2

## 2021-04-18 MED ORDER — PHENYLEPHRINE HCL (PRESSORS) 10 MG/ML IV SOLN
INTRAVENOUS | Status: DC | PRN
  Administered 2021-04-18: 80 ug via INTRAVENOUS

## 2021-04-18 MED ORDER — ROCURONIUM BROMIDE 100 MG/10ML IV SOLN
INTRAVENOUS | Status: DC | PRN
  Administered 2021-04-18: 60 mg via INTRAVENOUS

## 2021-04-18 MED ORDER — ONDANSETRON HCL 4 MG/2ML IJ SOLN: 4.0000 mg | Freq: Once | INTRAMUSCULAR | Status: DC | PRN

## 2021-04-18 MED ORDER — HYDROMORPHONE HCL 1 MG/ML IJ SOLN
0.2500 mg | INTRAMUSCULAR | Status: DC | PRN
  Administered 2021-04-18 (×2): 0.5 mg via INTRAVENOUS

## 2021-04-18 MED ORDER — HYDROMORPHONE HCL 1 MG/ML IJ SOLN
INTRAMUSCULAR | Status: AC
  Filled 2021-04-18: qty 0.5

## 2021-04-18 MED ORDER — LACTATED RINGERS IV SOLN: INTRAVENOUS | Status: DC

## 2021-04-18 MED ORDER — PROPOFOL 10 MG/ML IV BOLUS
INTRAVENOUS | Status: DC | PRN
  Administered 2021-04-18: 120 mg via INTRAVENOUS

## 2021-04-18 MED ORDER — OXYCODONE HCL 5 MG/5ML PO SOLN: 5.0000 mg | Freq: Once | ORAL | Status: DC | PRN

## 2021-04-18 MED ORDER — PROPOFOL 10 MG/ML IV BOLUS
INTRAVENOUS | Status: AC
  Filled 2021-04-18: qty 20

## 2021-04-18 MED ORDER — SUGAMMADEX SODIUM 500 MG/5ML IV SOLN
INTRAVENOUS | Status: AC
  Filled 2021-04-18: qty 5

## 2021-04-18 MED ORDER — CEFAZOLIN SODIUM-DEXTROSE 2-4 GM/100ML-% IV SOLN
INTRAVENOUS | Status: AC
  Filled 2021-04-18: qty 100

## 2021-04-18 MED ORDER — FENTANYL CITRATE (PF) 100 MCG/2ML IJ SOLN
INTRAMUSCULAR | Status: DC | PRN
  Administered 2021-04-18 (×2): 25 ug via INTRAVENOUS
  Administered 2021-04-18: 50 ug via INTRAVENOUS

## 2021-04-18 MED ORDER — DEXAMETHASONE SODIUM PHOSPHATE 4 MG/ML IJ SOLN
INTRAMUSCULAR | Status: DC | PRN
  Administered 2021-04-18: 4 mg via INTRAVENOUS

## 2021-04-18 MED ORDER — 0.9 % SODIUM CHLORIDE (POUR BTL) OPTIME
TOPICAL | Status: DC | PRN
  Administered 2021-04-18: 75 mL

## 2021-04-18 MED ORDER — SUGAMMADEX SODIUM 500 MG/5ML IV SOLN
INTRAVENOUS | Status: DC | PRN
Start: 2021-04-18 — End: 2021-04-18
  Administered 2021-04-18: 250 mg via INTRAVENOUS

## 2021-04-18 MED ORDER — PHENYLEPHRINE 40 MCG/ML (10ML) SYRINGE FOR IV PUSH (FOR BLOOD PRESSURE SUPPORT)
PREFILLED_SYRINGE | INTRAVENOUS | Status: AC
  Filled 2021-04-18: qty 10

## 2021-04-18 MED ORDER — MIDAZOLAM HCL 5 MG/5ML IJ SOLN
INTRAMUSCULAR | Status: DC | PRN
  Administered 2021-04-18: 2 mg via INTRAVENOUS

## 2021-04-18 MED ORDER — OXYCODONE HCL 5 MG PO TABS: 5.0000 mg | ORAL_TABLET | Freq: Once | ORAL | Status: DC | PRN

## 2021-04-18 MED ORDER — CEFAZOLIN SODIUM-DEXTROSE 2-4 GM/100ML-% IV SOLN
2.0000 g | INTRAVENOUS | Status: AC
  Administered 2021-04-18: 2 g via INTRAVENOUS

## 2021-04-18 MED ORDER — ACETAMINOPHEN 500 MG PO TABS
ORAL_TABLET | ORAL | Status: AC
  Filled 2021-04-18: qty 2

## 2021-04-18 MED ORDER — LIDOCAINE HCL (CARDIAC) PF 100 MG/5ML IV SOSY
PREFILLED_SYRINGE | INTRAVENOUS | Status: DC | PRN
  Administered 2021-04-18: 60 mg via INTRAVENOUS

## 2021-04-18 SURGICAL SUPPLY — 58 items
APL PRP STRL LF DISP 70% ISPRP (MISCELLANEOUS) ×1
APL SKNCLS STERI-STRIP NONHPOA (GAUZE/BANDAGES/DRESSINGS) ×1
BENZOIN TINCTURE PRP APPL 2/3 (GAUZE/BANDAGES/DRESSINGS) ×1 IMPLANT
BINDER ABDOMINAL 10 UNV 27-48 (MISCELLANEOUS) IMPLANT
BINDER ABDOMINAL 12 SM 30-45 (SOFTGOODS) IMPLANT
BLADE CLIPPER SURG (BLADE) ×1 IMPLANT
BLADE HEX COATED 2.75 (ELECTRODE) ×1 IMPLANT
BLADE SURG 10 STRL SS (BLADE) ×2 IMPLANT
BLADE SURG 15 STRL LF DISP TIS (BLADE) ×1 IMPLANT
BLADE SURG 15 STRL SS (BLADE) ×2
CANISTER SUCT 1200ML W/VALVE (MISCELLANEOUS) ×2 IMPLANT
CHLORAPREP W/TINT 26 (MISCELLANEOUS) ×2 IMPLANT
COVER BACK TABLE 60X90IN (DRAPES) ×2 IMPLANT
COVER MAYO STAND STRL (DRAPES) ×2 IMPLANT
DECANTER SPIKE VIAL GLASS SM (MISCELLANEOUS) ×1 IMPLANT
DRAPE LAPAROTOMY T 102X78X121 (DRAPES) ×2 IMPLANT
DRAPE UTILITY XL STRL (DRAPES) ×2 IMPLANT
DRSG TEGADERM 4X4.75 (GAUZE/BANDAGES/DRESSINGS) ×2 IMPLANT
ELECT REM PT RETURN 9FT ADLT (ELECTROSURGICAL) ×2
ELECTRODE REM PT RTRN 9FT ADLT (ELECTROSURGICAL) ×1 IMPLANT
GAUZE SPONGE 4X4 12PLY STRL LF (GAUZE/BANDAGES/DRESSINGS) ×4 IMPLANT
GLOVE SURG ENC MOIS LTX SZ7 (GLOVE) ×2 IMPLANT
GLOVE SURG POLYISO LF SZ6.5 (GLOVE) ×1 IMPLANT
GLOVE SURG UNDER POLY LF SZ6.5 (GLOVE) ×1 IMPLANT
GLOVE SURG UNDER POLY LF SZ7 (GLOVE) ×2 IMPLANT
GLOVE SURG UNDER POLY LF SZ7.5 (GLOVE) ×2 IMPLANT
GOWN STRL REUS W/ TWL LRG LVL3 (GOWN DISPOSABLE) ×1 IMPLANT
GOWN STRL REUS W/TWL LRG LVL3 (GOWN DISPOSABLE) ×6
MESH VENTRALEX ST 2.5 CRC MED (Mesh General) ×1 IMPLANT
NDL HYPO 25X1 1.5 SAFETY (NEEDLE) ×1 IMPLANT
NDL SPNL 25GX3.5 QUINCKE BL (NEEDLE) IMPLANT
NEEDLE HYPO 25X1 1.5 SAFETY (NEEDLE) ×2 IMPLANT
NEEDLE SPNL 25GX3.5 QUINCKE BL (NEEDLE) IMPLANT
NS IRRIG 1000ML POUR BTL (IV SOLUTION) ×2 IMPLANT
PACK BASIN DAY SURGERY FS (CUSTOM PROCEDURE TRAY) ×2 IMPLANT
PENCIL SMOKE EVACUATOR (MISCELLANEOUS) ×2 IMPLANT
SLEEVE SCD COMPRESS KNEE MED (STOCKING) ×1 IMPLANT
SPONGE GAUZE 2X2 8PLY STRL LF (GAUZE/BANDAGES/DRESSINGS) IMPLANT
SPONGE T-LAP 18X18 ~~LOC~~+RFID (SPONGE) ×1 IMPLANT
SPONGE T-LAP 4X18 ~~LOC~~+RFID (SPONGE) ×2 IMPLANT
STAPLER VISISTAT 35W (STAPLE) IMPLANT
STRIP CLOSURE SKIN 1/2X4 (GAUZE/BANDAGES/DRESSINGS) ×1 IMPLANT
SUT MNCRL AB 4-0 PS2 18 (SUTURE) ×2 IMPLANT
SUT NOVA 0 T19/GS 22DT (SUTURE) ×2 IMPLANT
SUT NOVA NAB DX-16 0-1 5-0 T12 (SUTURE) IMPLANT
SUT NOVA NAB GS-21 1 T12 (SUTURE) ×3 IMPLANT
SUT PROLENE 0 CT 2 (SUTURE) IMPLANT
SUT PROLENE 2 0 CT2 30 (SUTURE) IMPLANT
SUT SILK 2 0 TIES 17X18 (SUTURE)
SUT SILK 2-0 18XBRD TIE BLK (SUTURE) IMPLANT
SUT SURG 0 T 19/GS 22 1969 62 (SUTURE) IMPLANT
SUT VIC AB 3-0 SH 27 (SUTURE) ×2
SUT VIC AB 3-0 SH 27X BRD (SUTURE) ×1 IMPLANT
SYR BULB EAR ULCER 3OZ GRN STR (SYRINGE) ×1 IMPLANT
SYR CONTROL 10ML LL (SYRINGE) ×2 IMPLANT
TOWEL GREEN STERILE FF (TOWEL DISPOSABLE) ×2 IMPLANT
TUBE CONNECTING 20X1/4 (TUBING) ×2 IMPLANT
YANKAUER SUCT BULB TIP NO VENT (SUCTIONS) ×2 IMPLANT

## 2021-04-18 NOTE — Transfer of Care (Signed)
Immediate Anesthesia Transfer of Care Note  Patient: Michele Reyes  Procedure(s) Performed: OPEN VENTRAL HERNIA REPAIR WITH MESH (Abdomen)  Patient Location: PACU  Anesthesia Type:General  Level of Consciousness: drowsy, patient cooperative and responds to stimulation  Airway & Oxygen Therapy: Patient Spontanous Breathing and Patient connected to face mask oxygen  Post-op Assessment: Report given to RN and Post -op Vital signs reviewed and stable  Post vital signs: Reviewed and stable  Last Vitals:  Vitals Value Taken Time  BP    Temp    Pulse 64 04/18/21 0949  Resp    SpO2 97 % 04/18/21 0949  Vitals shown include unvalidated device data.  Last Pain:  Vitals:   04/18/21 0723  TempSrc: Oral  PainSc: 0-No pain         Complications: No notable events documented.

## 2021-04-18 NOTE — Discharge Instructions (Addendum)
[facility] _______[facility]  HERNIA REPAIR: POST OP INSTRUCTIONS  Always review your discharge instruction sheet given to you by the facility where your surgery was performed. IF YOU HAVE DISABILITY OR FAMILY LEAVE FORMS, YOU MUST BRING THEM TO THE OFFICE FOR PROCESSING.   DO NOT GIVE THEM TO YOUR DOCTOR.  1. A  prescription for pain medication may be given to you upon discharge.  Take your pain medication as prescribed, if needed.  If narcotic pain medicine is not needed, then you may take acetaminophen (Tylenol) or ibuprofen (Advil) as needed. 2. Take your usually prescribed medications unless otherwise directed. If you need a refill on your pain medication, please contact your pharmacy.  They will contact our office to request authorization. Prescriptions will not be filled after 5 pm or on week-ends. 3. You should follow a light diet the first 24 hours after arrival home, such as soup and crackers, etc.  Be sure to include lots of fluids daily.  Resume your normal diet the day after surgery. 4.Most patients will experience some swelling and bruising around the umbilicus or in the groin and scrotum.  Ice packs and reclining will help.  Swelling and bruising can take several days to resolve.  6. It is common to experience some constipation if taking pain medication after surgery.  Increasing fluid intake and taking a stool softener (such as Colace) will usually help or prevent this problem from occurring.  A mild laxative (Milk of Magnesia or Miralax) should be taken according to package directions if there are no bowel movements after 48 hours. 7. Unless discharge instructions indicate otherwise, you may remove your bandages 24-48 hours after surgery, and you may shower at that time.  You may have steri-strips (small skin tapes) in place directly over the incision.  These strips should be left on the skin for 7-10 days.  If your surgeon used skin glue on the incision, you may shower in  24 hours.  The glue will flake off over the next 2-3 weeks.  Any sutures or staples will be removed at the office during your follow-up visit. 8. ACTIVITIES:  You may resume regular (light) daily activities beginning the next day--such as daily self-care, walking, climbing stairs--gradually increasing activities as tolerated.  You may have sexual intercourse when it is comfortable.  Refrain from any heavy lifting or straining until approved by your doctor.  a.You may drive when you are no longer taking prescription pain medication, you can comfortably wear a seatbelt, and you can safely maneuver your car and apply brakes. b.RETURN TO WORK:   _____________________________________________  9.You should see your doctor in the office for a follow-up appointment approximately 2-3 weeks after your surgery.  Make sure that you call for this appointment within a day or two after you arrive home to insure a convenient appointment time. 10.OTHER INSTRUCTIONS: _________________________    _____________________________________  WHEN TO CALL YOUR DOCTOR: Fever over 101.0 Inability to urinate Nausea and/or vomiting Extreme swelling or bruising Continued bleeding from incision. Increased pain, redness, or drainage from the incision  The clinic staff is available to answer your questions during regular business hours.  Please dont hesitate to call and ask to speak to one of the nurses for clinical concerns.  If you have a medical emergency, go to the nearest emergency room or call 911.  A surgeon from [facility] is always on call at the hospital   [address] [place], [place], [place]  [address] ?  [address], [place], [place]   [id number] [phone] ? [phone] ? FAX [phone] Web site: [email or web address]  Post Anesthesia Home Care Instructions  Activity: Get plenty of rest for the remainder of the day. A responsible individual must stay with you for 24  hours following the procedure.  For the next 24 hours, DO NOT: -Drive a car -Paediatric nurse -Drink alcoholic beverages -Take any medication unless instructed by your physician -Make any legal decisions or sign important papers.  Meals: Start with liquid foods such as gelatin or soup. Progress to regular foods as tolerated. Avoid greasy, spicy, heavy foods. If nausea and/or vomiting occur, drink only clear liquids until the nausea and/or vomiting subsides. Call your physician if vomiting continues.  Special Instructions/Symptoms: Your throat may feel dry or sore from the anesthesia or the breathing tube placed in your throat during surgery. If this causes discomfort, gargle with warm salt water. The discomfort should disappear within 24 hours.  If you had a scopolamine patch placed behind your ear for the management of post- operative nausea and/or vomiting:  1. The medication in the patch is effective for 72 hours, after which it should be removed.  Wrap patch in a tissue and discard in the trash. Wash hands thoroughly with soap and water. 2. You may remove the patch earlier than 72 hours if you experience unpleasant side effects which may include dry mouth, dizziness or visual disturbances. 3. Avoid touching the patch. Wash your hands with soap and water after contact with the patch.    Next tylenol 1:30pm.

## 2021-04-18 NOTE — Anesthesia Preprocedure Evaluation (Addendum)
Anesthesia Evaluation  Patient identified by MRN, date of birth, ID band Patient awake    Reviewed: Allergy & Precautions, NPO status , Patient's Chart, lab work & pertinent test results, reviewed documented beta blocker date and time   Airway Mallampati: II  TM Distance: >3 FB Neck ROM: Full    Dental no notable dental hx. (+) Teeth Intact, Caps, Dental Advisory Given   Pulmonary asthma , COPD,  COPD inhaler, former smoker,    Pulmonary exam normal breath sounds clear to auscultation       Cardiovascular hypertension, Pt. on medications Normal cardiovascular exam Rhythm:Regular Rate:Normal     Neuro/Psych negative neurological ROS  negative psych ROS   GI/Hepatic Neg liver ROS, GERD  Medicated,  Endo/Other  Morbid obesityHyperlipidemia  Renal/GU negative Renal ROS  negative genitourinary   Musculoskeletal  (+) Arthritis , Osteoarthritis,  Ventral hernia Psoriasis OA thumbs   Abdominal (+) + obese,   Peds  Hematology negative hematology ROS (+)   Anesthesia Other Findings   Reproductive/Obstetrics                            Anesthesia Physical Anesthesia Plan  ASA: 3  Anesthesia Plan: General   Post-op Pain Management:    Induction: Intravenous  PONV Risk Score and Plan: 4 or greater and Treatment may vary due to age or medical condition, Ondansetron and Dexamethasone  Airway Management Planned: Oral ETT  Additional Equipment: None  Intra-op Plan:   Post-operative Plan: Extubation in OR  Informed Consent: I have reviewed the patients History and Physical, chart, labs and discussed the procedure including the risks, benefits and alternatives for the proposed anesthesia with the patient or authorized representative who has indicated his/her understanding and acceptance.     Dental advisory given  Plan Discussed with: CRNA and Anesthesiologist  Anesthesia Plan Comments:          Anesthesia Quick Evaluation

## 2021-04-18 NOTE — Interval H&P Note (Signed)
History and Physical Interval Note:  04/18/2021 7:18 AM  Michele Reyes  has presented today for surgery, with the diagnosis of SUPRAUMBILICAL VENTRAL HERNIA 3.5cm.  The various methods of treatment have been discussed with the patient and family. After consideration of risks, benefits and other options for treatment, the patient has consented to  Procedure(s): OPEN VENTRAL HERNIA REPAIR WITH MESH (N/A) as a surgical intervention.  The patient's history has been reviewed, patient examined, no change in status, stable for surgery.  I have reviewed the patient's chart and labs.  Questions were answered to the patient's satisfaction.     Maia Petties

## 2021-04-18 NOTE — Anesthesia Postprocedure Evaluation (Signed)
Anesthesia Post Note  Patient: Michele Reyes  Procedure(s) Performed: OPEN VENTRAL HERNIA REPAIR WITH MESH (Abdomen)     Patient location during evaluation: PACU Anesthesia Type: General Level of consciousness: awake and alert and oriented Pain management: pain level controlled Vital Signs Assessment: post-procedure vital signs reviewed and stable Respiratory status: spontaneous breathing, nonlabored ventilation and respiratory function stable Cardiovascular status: blood pressure returned to baseline and stable Postop Assessment: no apparent nausea or vomiting Anesthetic complications: no   No notable events documented.  Last Vitals:  Vitals:   04/18/21 1015 04/18/21 1030  BP: 124/68 116/65  Pulse: 71 68  Resp: 15 (!) 21  Temp:    SpO2: 97% 96%    Last Pain:  Vitals:   04/18/21 1014  TempSrc:   PainSc: 6                  [name] A.

## 2021-04-18 NOTE — Op Note (Signed)
Indications:  The patient presented with a history of a supraumbilical ventral hernia.  The patient was examined and we recommended ventral hernia repair with mesh.  The defect is about 3.5 cm in diameter  Pre-operative diagnosis:  Supraumbilical ventralhernia  Post-operative diagnosis:  Same  Procedure:  Ventral hernia repair with mesh  Procedure Details  The patient was seen again in the Holding Room. The risks, benefits, complications, treatment options, and expected outcomes were discussed with the patient. The possibilities of reaction to medication, pulmonary aspiration, perforation of viscus, bleeding, recurrent infection, the need for additional procedures, and development of a complication requiring transfusion or further operation were discussed with the patient and/or family. There was concurrence with the proposed plan, and informed consent was obtained. The site of surgery was properly noted/marked. The patient was taken to the Operating Room, identified as [name], and the procedure verified as ventral hernia repair. A Time Out was held and the above information confirmed.  After an adequate level of general anesthesia was obtained, the patient's abdomen was prepped with Chloraprep and draped in sterile fashion.  We made a vertical incision extending above the umbilicus.  Dissection was carried down to the hernia sac with cautery.  We dissected bluntly around the hernia sac down to the edge of the fascial defect.  We reduced the hernia sac back into the pre-peritoneal space.  The fascial defect measured 3.5 cm.  We cleared the fascia in all directions.  No other defects were palpated.  A medium Ventralex mesh was inserted into the pre-peritoneal space and was deployed.  The mesh was secured with four trans-fascial sutures of 0 Novofil.  The fascial defect was closed with multiple interrupted figure-of-eight 1 Novofil sutures.  3-0 Vicryl was used to close the subcutaneous tissues  and 4-0 Monocryl was used to close the skin.  Steri-strips and clean dressing were applied.  The patient was extubated and brought to the recovery room in stable condition.  All sponge, instrument, and needle counts were correct prior to closure and at the conclusion of the case.   Estimated Blood Loss: Minimal          Complications: None; patient tolerated the procedure well.         Disposition: PACU - hemodynamically stable.         Condition: stable  Michele Reyes. Georgette Dover, MD, [facility]  General Surgery   04/18/2021 9:47 AM

## 2021-04-18 NOTE — Anesthesia Procedure Notes (Signed)
Procedure Name: Intubation Date/Time: 04/18/2021 8:50 AM Performed by: Glory Buff, CRNA Pre-anesthesia Checklist: Patient identified, Emergency Drugs available, Suction available and Patient being monitored Patient Re-evaluated:Patient Re-evaluated prior to induction Oxygen Delivery Method: Circle system utilized Preoxygenation: Pre-oxygenation with 100% oxygen Induction Type: IV induction Ventilation: Mask ventilation without difficulty Laryngoscope Size: Miller and 3 Grade View: Grade I Tube type: Oral Tube size: 7.0 mm Number of attempts: 1 Airway Equipment and Method: Stylet and Oral airway Placement Confirmation: ETT inserted through vocal cords under direct vision, positive ETCO2 and breath sounds checked- equal and bilateral Secured at: 21 cm Tube secured with: Tape Dental Injury: Teeth and Oropharynx as per pre-operative assessment

## 2021-04-19 ENCOUNTER — Encounter (HOSPITAL_BASED_OUTPATIENT_CLINIC_OR_DEPARTMENT_OTHER): Payer: Self-pay | Admitting: Surgery

## 2021-04-19 NOTE — Addendum Note (Signed)
Addendum  created 04/19/21 1158 by Maryella Shivers, CRNA   Charge Capture section accepted

## 2022-03-05 IMAGING — CT CT ABD-PELV W/ CM
2 of 5 series · 14 of 46 positions shown, 16 images · IV contrast (APPLIED)
Comparison: None.
COMPARISON: None.

Addendum:
CLINICAL DATA: Abdominal pain.  Rectal bleeding.

EXAM:
CT ABDOMEN AND PELVIS WITH CONTRAST
TECHNIQUE: Multidetector CT imaging of the abdomen and pelvis was performed
using the standard protocol following bolus administration of
intravenous contrast.
CONTRAST:  100mL OMNIPAQUE IOHEXOL 300 MG/ML  SOLN

[Series 2: abd pel w · axial · 0.82mm/px · z∈[+706,+1076]mm · 11 of 84 slices shown, 13 images]
[im 5/84  soft-tissue]
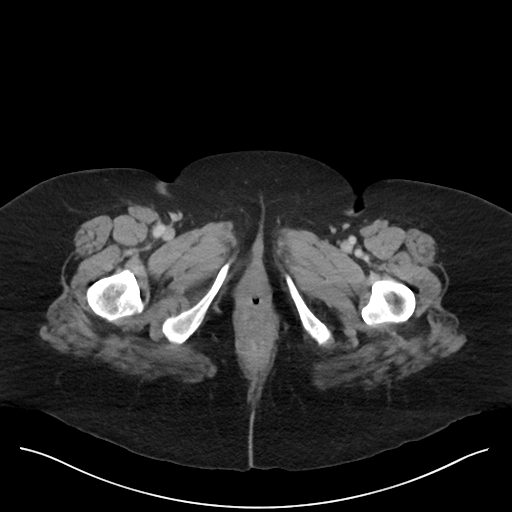
[im 5/84  bone]
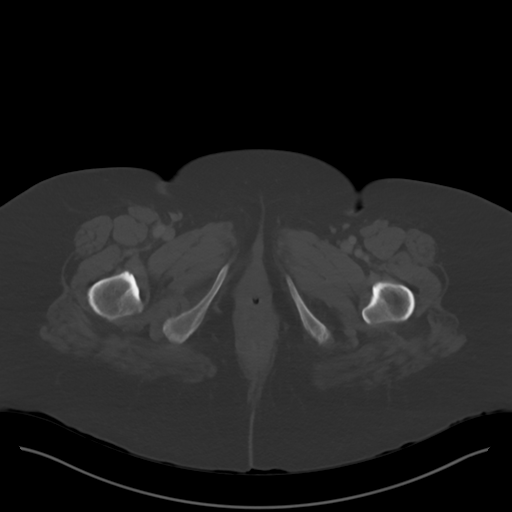
[im 14/84  soft-tissue]
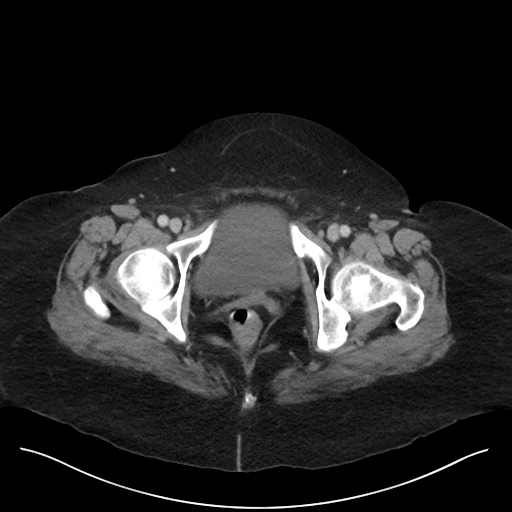
[im 19/84  soft-tissue]
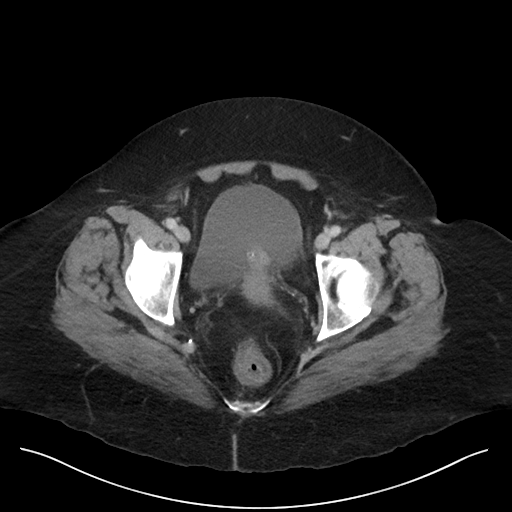
[im 28/84  soft-tissue]
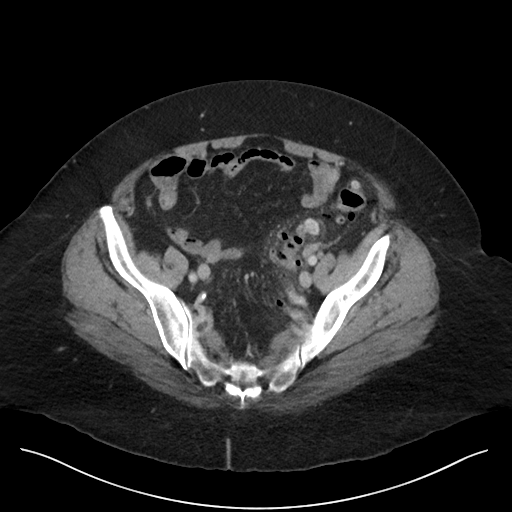
[im 33/84  soft-tissue]
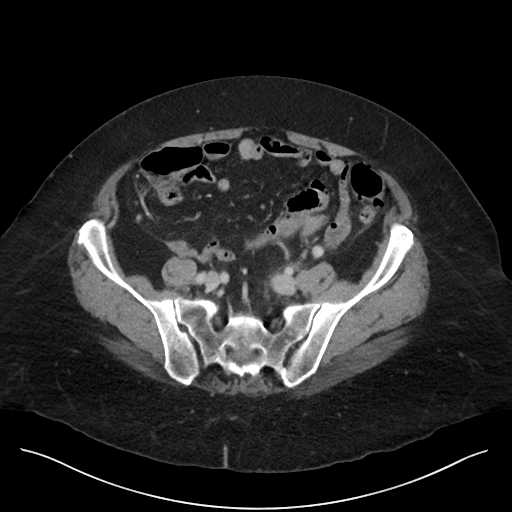
[im 42/84  soft-tissue]
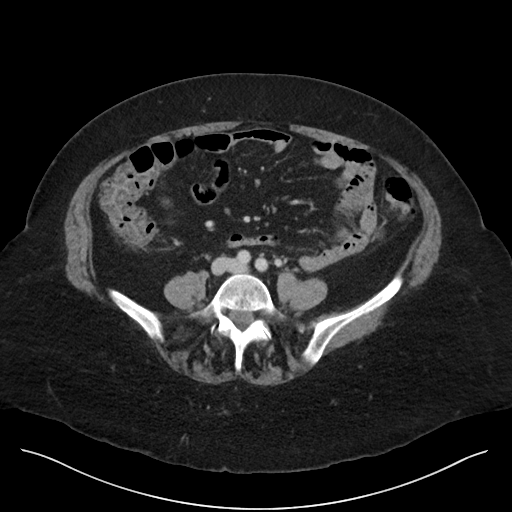
[im 51/84  soft-tissue]
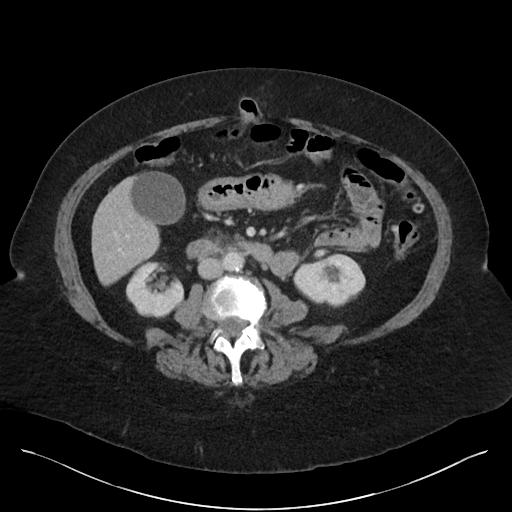
[im 56/84  soft-tissue]
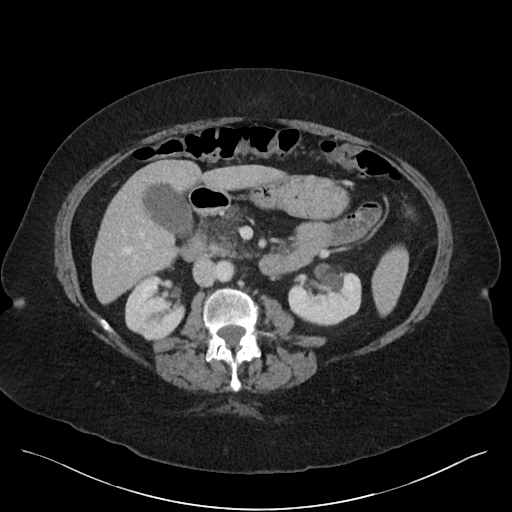
[im 65/84  soft-tissue]
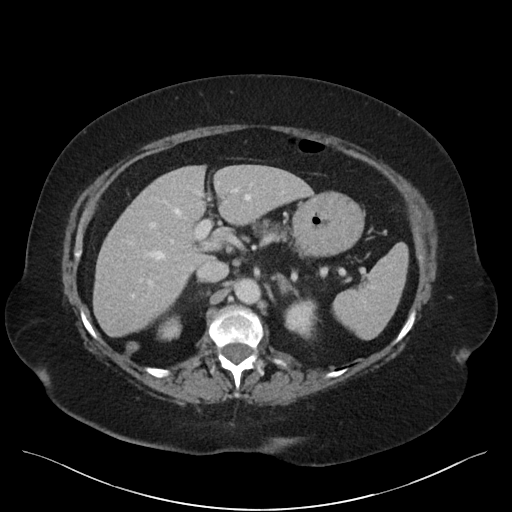
[im 65/84  bone]
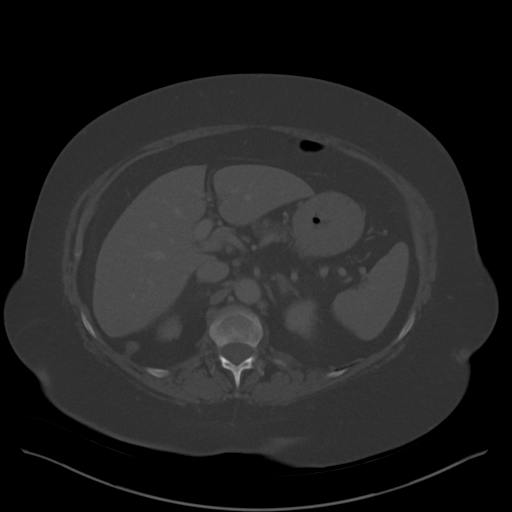
[im 70/84  soft-tissue]
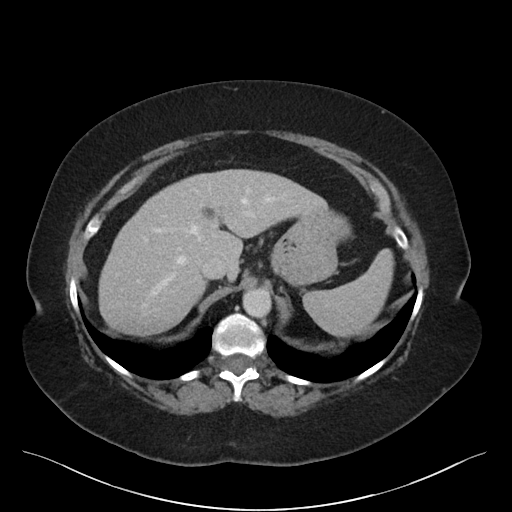
[im 79/84  soft-tissue]
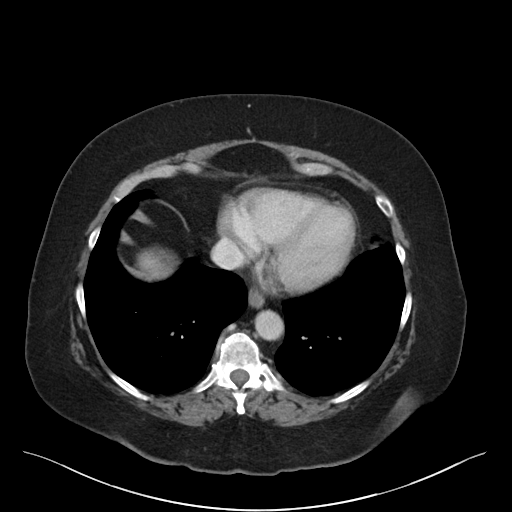

[Series 5: coronal · coronal · 0.87mm/px · 3 of 107 slices shown]
[im 36/107  soft-tissue]
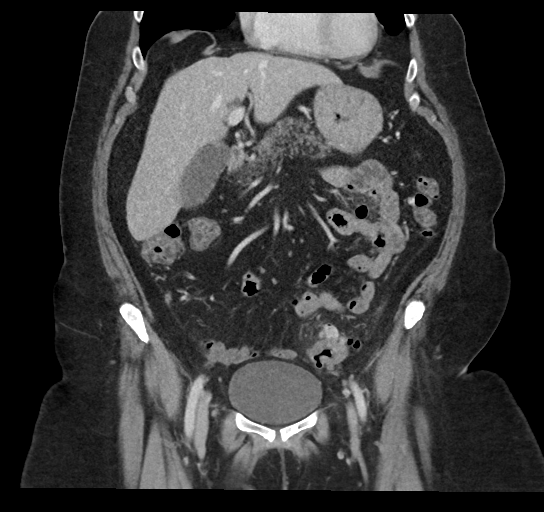
[im 48/107  soft-tissue]
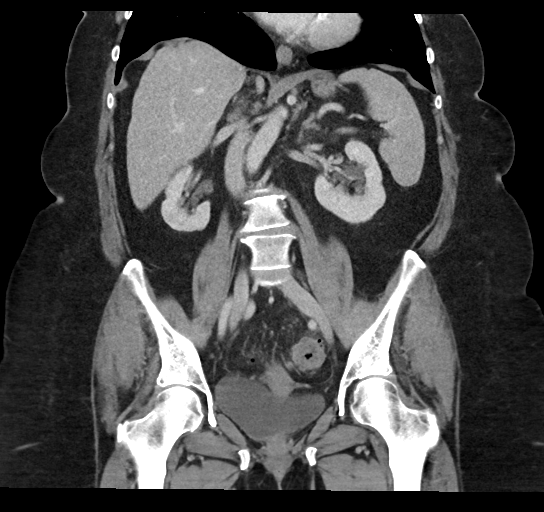
[im 59/107  soft-tissue]
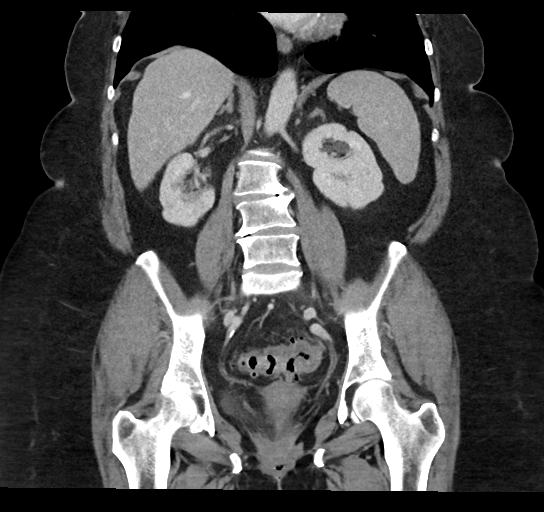

[14 of 46 positions shown; findings below may reference images not displayed]

FINDINGS: Lower chest: Unremarkable.

Hepatobiliary: 14 mm cyst identified medial segment left liver.
Gallbladder is distended. No intrahepatic or extrahepatic biliary
dilation.

Pancreas: No focal mass lesion. No dilatation of the main duct. No
intraparenchymal cyst. No peripancreatic edema.

Spleen: No splenomegaly. No focal mass lesion.

Adrenals/Urinary Tract: No adrenal nodule or mass. Right kidney
unremarkable. Central sinus cysts noted in the left kidney. No
evidence for hydroureter. The urinary bladder appears normal for the
degree of distention.

Stomach/Bowel: Stomach is unremarkable. No gastric wall thickening.
No evidence of outlet obstruction. Duodenum is normally positioned
as is the ligament of Treitz. No small bowel wall thickening. No
small bowel dilatation. The terminal ileum is normal. The appendix
is normal. There is diffuse diverticular disease. Sigmoid colon and
rectum show circumferential wall thickening with pericolonic
edema/inflammation. Segment of colon involved is longer than
typically seen in the setting of diverticulitis and while this is a
consideration, infectious/inflammatory colitis is favored by
imaging.

Vascular/Lymphatic: There is mild atherosclerotic calcification of
the abdominal aorta without aneurysm. There is no gastrohepatic or
hepatoduodenal ligament lymphadenopathy. No retroperitoneal or
mesenteric lymphadenopathy. No pelvic sidewall lymphadenopathy.

Reproductive: 2.2 cm exophytic fibroid noted uterine fundus. There
is no adnexal mass.

Other: No intraperitoneal free fluid.

Musculoskeletal: No worrisome lytic or sclerotic osseous
abnormality.
IMPRESSION: 1. Circumferential wall thickening in the sigmoid colon and rectum
with pericolonic edema/inflammation. While there is left-sided
diverticulosis, segment of colon involved is longer than typically
seen in the setting of diverticulitis and left sided
infectious/inflammatory colitis is favored by imaging.
2. Diffuse diverticular disease.
3. Aortic Atherosclerosis (LJRKW-BLC.C).

ADDENDUM:
Not included in the original report, the patient is noted to have a
supraumbilical midline ventral hernia about 3 cm cranial to the
umbilicus containing a short segment of small bowel without
complicating features. Just to the left of this hernia is another
tiny fascial defect containing only fat. Associated small umbilical
fat containing hernia also noted.

*** End of Addendum ***
FINDINGS: Lower chest: Unremarkable.

Hepatobiliary: 14 mm cyst identified medial segment left liver.
Gallbladder is distended. No intrahepatic or extrahepatic biliary
dilation.

Pancreas: No focal mass lesion. No dilatation of the main duct. No
intraparenchymal cyst. No peripancreatic edema.

Spleen: No splenomegaly. No focal mass lesion.

Adrenals/Urinary Tract: No adrenal nodule or mass. Right kidney
unremarkable. Central sinus cysts noted in the left kidney. No
evidence for hydroureter. The urinary bladder appears normal for the
degree of distention.

Stomach/Bowel: Stomach is unremarkable. No gastric wall thickening.
No evidence of outlet obstruction. Duodenum is normally positioned
as is the ligament of Treitz. No small bowel wall thickening. No
small bowel dilatation. The terminal ileum is normal. The appendix
is normal. There is diffuse diverticular disease. Sigmoid colon and
rectum show circumferential wall thickening with pericolonic
edema/inflammation. Segment of colon involved is longer than
typically seen in the setting of diverticulitis and while this is a
consideration, infectious/inflammatory colitis is favored by
imaging.

Vascular/Lymphatic: There is mild atherosclerotic calcification of
the abdominal aorta without aneurysm. There is no gastrohepatic or
hepatoduodenal ligament lymphadenopathy. No retroperitoneal or
mesenteric lymphadenopathy. No pelvic sidewall lymphadenopathy.

Reproductive: 2.2 cm exophytic fibroid noted uterine fundus. There
is no adnexal mass.

Other: No intraperitoneal free fluid.

Musculoskeletal: No worrisome lytic or sclerotic osseous
abnormality.
IMPRESSION: 1. Circumferential wall thickening in the sigmoid colon and rectum
with pericolonic edema/inflammation. While there is left-sided
diverticulosis, segment of colon involved is longer than typically
seen in the setting of diverticulitis and left sided
infectious/inflammatory colitis is favored by imaging.
2. Diffuse diverticular disease.
3. Aortic Atherosclerosis (LJRKW-BLC.C).

## 2022-03-19 ENCOUNTER — Inpatient Hospital Stay (HOSPITAL_COMMUNITY): Payer: PPO

## 2022-03-19 ENCOUNTER — Other Ambulatory Visit: Payer: Self-pay

## 2022-03-19 ENCOUNTER — Observation Stay (HOSPITAL_BASED_OUTPATIENT_CLINIC_OR_DEPARTMENT_OTHER)
Admission: EM | Admit: 2022-03-19 | Discharge: 2022-03-20 | Disposition: A | Discharge status: Home / Self Care | Payer: PPO | Attending: Cardiology | Admitting: Cardiology

## 2022-03-19 ENCOUNTER — Emergency Department (HOSPITAL_BASED_OUTPATIENT_CLINIC_OR_DEPARTMENT_OTHER): Payer: PPO | Admitting: Radiology

## 2022-03-19 ENCOUNTER — Ambulatory Visit (HOSPITAL_COMMUNITY)
Admission: EM | Disposition: A | Discharge status: Home / Self Care | Payer: Self-pay | Source: Home / Self Care | Attending: Emergency Medicine

## 2022-03-19 ENCOUNTER — Encounter (HOSPITAL_BASED_OUTPATIENT_CLINIC_OR_DEPARTMENT_OTHER): Payer: Self-pay

## 2022-03-19 DIAGNOSIS — I300 Acute nonspecific idiopathic pericarditis: Principal | ICD-10-CM

## 2022-03-19 DIAGNOSIS — Z87891 Personal history of nicotine dependence: Secondary | ICD-10-CM | POA: Insufficient documentation

## 2022-03-19 DIAGNOSIS — E7800 Pure hypercholesterolemia, unspecified: Secondary | ICD-10-CM | POA: Diagnosis not present

## 2022-03-19 DIAGNOSIS — J449 Chronic obstructive pulmonary disease, unspecified: Secondary | ICD-10-CM | POA: Diagnosis not present

## 2022-03-19 DIAGNOSIS — Z79899 Other long term (current) drug therapy: Secondary | ICD-10-CM | POA: Insufficient documentation

## 2022-03-19 DIAGNOSIS — E119 Type 2 diabetes mellitus without complications: Secondary | ICD-10-CM | POA: Diagnosis not present

## 2022-03-19 DIAGNOSIS — I10 Essential (primary) hypertension: Secondary | ICD-10-CM | POA: Insufficient documentation

## 2022-03-19 DIAGNOSIS — R0602 Shortness of breath: Secondary | ICD-10-CM | POA: Insufficient documentation

## 2022-03-19 DIAGNOSIS — I2119 ST elevation (STEMI) myocardial infarction involving other coronary artery of inferior wall: Secondary | ICD-10-CM | POA: Diagnosis present

## 2022-03-19 DIAGNOSIS — R059 Cough, unspecified: Secondary | ICD-10-CM | POA: Diagnosis not present

## 2022-03-19 DIAGNOSIS — I213 ST elevation (STEMI) myocardial infarction of unspecified site: Secondary | ICD-10-CM | POA: Diagnosis not present

## 2022-03-19 DIAGNOSIS — J45909 Unspecified asthma, uncomplicated: Secondary | ICD-10-CM | POA: Diagnosis not present

## 2022-03-19 DIAGNOSIS — I201 Angina pectoris with documented spasm: Secondary | ICD-10-CM | POA: Insufficient documentation

## 2022-03-19 DIAGNOSIS — Z1152 Encounter for screening for COVID-19: Secondary | ICD-10-CM | POA: Insufficient documentation

## 2022-03-19 DIAGNOSIS — R072 Precordial pain: Secondary | ICD-10-CM | POA: Diagnosis present

## 2022-03-19 DIAGNOSIS — R9431 Abnormal electrocardiogram [ECG] [EKG]: Secondary | ICD-10-CM

## 2022-03-19 HISTORY — DX: Hyperlipidemia, unspecified: E78.5

## 2022-03-19 HISTORY — PX: LEFT HEART CATH AND CORONARY ANGIOGRAPHY: CATH118249

## 2022-03-19 LAB — CBC
HCT: 42.5 % (ref 36.0–46.0)
Hemoglobin: 13.9 g/dL (ref 12.0–15.0)
MCH: 29.5 pg (ref 26.0–34.0)
MCHC: 32.7 g/dL (ref 30.0–36.0)
MCV: 90.2 fL (ref 80.0–100.0)
Platelets: 248 10*3/uL (ref 150–400)
RBC: 4.71 MIL/uL (ref 3.87–5.11)
RDW: 13.5 % (ref 11.5–15.5)
WBC: 10.6 10*3/uL — ABNORMAL HIGH (ref 4.0–10.5)
nRBC: 0 % (ref 0.0–0.2)

## 2022-03-19 LAB — BASIC METABOLIC PANEL
Anion gap: 9 (ref 5–15)
BUN: 27 mg/dL — ABNORMAL HIGH (ref 8–23)
CO2: 24 mmol/L (ref 22–32)
Calcium: 9.2 mg/dL (ref 8.9–10.3)
Chloride: 105 mmol/L (ref 98–111)
Creatinine, Ser: 0.85 mg/dL (ref 0.44–1.00)
GFR, Estimated: >60 mL/min (ref >60)
Glucose, Bld: 110 mg/dL — ABNORMAL HIGH (ref 70–99)
Potassium: 3.8 mmol/L (ref 3.5–5.1)
Sodium: 138 mmol/L (ref 135–145)

## 2022-03-19 LAB — RESP PANEL BY RT-PCR (RSV, FLU A&B, COVID)  RVPGX2
Influenza A by PCR: NEGATIVE
Influenza B by PCR: NEGATIVE
Resp Syncytial Virus by PCR: NEGATIVE
SARS Coronavirus 2 by RT PCR: NEGATIVE

## 2022-03-19 LAB — PROTIME-INR
INR: 1 (ref 0.8–1.2)
Prothrombin Time: 13 seconds (ref 11.4–15.2)

## 2022-03-19 LAB — TROPONIN I (HIGH SENSITIVITY)
Troponin I (High Sensitivity): 5 ng/L (ref <18)
Troponin I (High Sensitivity): 3 ng/L (ref <18)

## 2022-03-19 LAB — LIPID PANEL
Cholesterol: 172 mg/dL (ref 0–200)
HDL: 60 mg/dL (ref >40)
LDL Cholesterol: 91 mg/dL (ref 0–99)
Total CHOL/HDL Ratio: 2.9 RATIO
Triglycerides: 103 mg/dL (ref <150)
VLDL: 21 mg/dL (ref 0–40)

## 2022-03-19 LAB — HEPATIC FUNCTION PANEL
ALT: 12 U/L (ref 0–44)
AST: 18 U/L (ref 15–41)
Albumin: 4.4 g/dL (ref 3.5–5.0)
Alkaline Phosphatase: 66 U/L (ref 38–126)
Bilirubin, Direct: 0.1 mg/dL (ref 0.0–0.2)
Indirect Bilirubin: 0.4 mg/dL (ref 0.3–0.9)
Total Bilirubin: 0.5 mg/dL (ref 0.3–1.2)
Total Protein: 7.5 g/dL (ref 6.5–8.1)

## 2022-03-19 LAB — ECHOCARDIOGRAM COMPLETE
Area-P 1/2: 4.55 cm2
Height: 64 in
S' Lateral: 1.8 cm
Weight: 3840 oz

## 2022-03-19 LAB — APTT: aPTT: 29 seconds (ref 24–36)

## 2022-03-19 LAB — POCT ACTIVATED CLOTTING TIME: Activated Clotting Time: 282 seconds

## 2022-03-19 SURGERY — LEFT HEART CATH AND CORONARY ANGIOGRAPHY
Anesthesia: LOCAL

## 2022-03-19 MED ORDER — SACCHAROMYCES BOULARDII 250 MG PO CAPS
250.0000 mg | ORAL_CAPSULE | Freq: Two times a day (BID) | ORAL | Status: DC
  Administered 2022-03-19 – 2022-03-20 (×2): 250 mg via ORAL
  Filled 2022-03-19 (×4): qty 1

## 2022-03-19 MED ORDER — IOHEXOL 350 MG/ML SOLN
INTRAVENOUS | Status: DC | PRN
  Administered 2022-03-19: 190 mL

## 2022-03-19 MED ORDER — VERAPAMIL HCL 2.5 MG/ML IV SOLN: INTRAVENOUS | Status: DC | PRN

## 2022-03-19 MED ORDER — VERAPAMIL HCL 2.5 MG/ML IV SOLN
INTRAVENOUS | Status: AC
  Filled 2022-03-19: qty 2

## 2022-03-19 MED ORDER — NITROGLYCERIN 1 MG/10 ML FOR IR/CATH LAB
INTRA_ARTERIAL | Status: DC | PRN
  Administered 2022-03-19 (×2): 200 ug via INTRACORONARY

## 2022-03-19 MED ORDER — LOSARTAN POTASSIUM 50 MG PO TABS
100.0000 mg | ORAL_TABLET | Freq: Every day | ORAL | Status: DC
  Administered 2022-03-20: 100 mg via ORAL
  Filled 2022-03-19: qty 2

## 2022-03-19 MED ORDER — DILTIAZEM HCL 25 MG/5ML IV SOLN
20.0000 mg | Freq: Once | INTRAVENOUS | Status: AC
  Administered 2022-03-19: 20 mg via INTRAVENOUS
  Filled 2022-03-19: qty 5

## 2022-03-19 MED ORDER — FENTANYL CITRATE (PF) 100 MCG/2ML IJ SOLN
INTRAMUSCULAR | Status: AC
  Filled 2022-03-19: qty 2

## 2022-03-19 MED ORDER — ONDANSETRON HCL 4 MG/2ML IJ SOLN
INTRAMUSCULAR | Status: AC
  Filled 2022-03-19: qty 2

## 2022-03-19 MED ORDER — MORPHINE SULFATE (PF) 4 MG/ML IV SOLN
4.0000 mg | Freq: Once | INTRAVENOUS | Status: AC
  Administered 2022-03-19: 4 mg via INTRAVENOUS
  Filled 2022-03-19: qty 1

## 2022-03-19 MED ORDER — SODIUM CHLORIDE 0.9 % WEIGHT BASED INFUSION: 1.0000 mL/kg/h | INTRAVENOUS | Status: DC

## 2022-03-19 MED ORDER — HEPARIN (PORCINE) IN NACL 1000-0.9 UT/500ML-% IV SOLN
INTRAVENOUS | Status: DC | PRN
  Administered 2022-03-19 (×2): 500 mL

## 2022-03-19 MED ORDER — NITROGLYCERIN 1 MG/10 ML FOR IR/CATH LAB
INTRA_ARTERIAL | Status: AC
  Filled 2022-03-19: qty 10

## 2022-03-19 MED ORDER — LIDOCAINE HCL (PF) 1 % IJ SOLN
INTRAMUSCULAR | Status: AC
  Filled 2022-03-19: qty 30

## 2022-03-19 MED ORDER — HEPARIN SODIUM (PORCINE) 5000 UNIT/ML IJ SOLN
60.0000 [IU]/kg | Freq: Once | INTRAMUSCULAR | Status: AC
  Administered 2022-03-19: 6550 [IU] via INTRAVENOUS
  Filled 2022-03-19: qty 2

## 2022-03-19 MED ORDER — SODIUM CHLORIDE 0.9% FLUSH: 3.0000 mL | INTRAVENOUS | Status: DC | PRN

## 2022-03-19 MED ORDER — ACETAMINOPHEN 325 MG PO TABS
650.0000 mg | ORAL_TABLET | ORAL | Status: DC | PRN
  Administered 2022-03-19 – 2022-03-20 (×2): 650 mg via ORAL
  Filled 2022-03-19 (×2): qty 2

## 2022-03-19 MED ORDER — FENTANYL CITRATE (PF) 100 MCG/2ML IJ SOLN
INTRAMUSCULAR | Status: DC | PRN
  Administered 2022-03-19: 50 ug via INTRAVENOUS
  Administered 2022-03-19: 50 ug

## 2022-03-19 MED ORDER — SODIUM CHLORIDE 0.9 % IV SOLN: INTRAVENOUS | Status: DC

## 2022-03-19 MED ORDER — HEPARIN (PORCINE) IN NACL 1000-0.9 UT/500ML-% IV SOLN
INTRAVENOUS | Status: AC
  Filled 2022-03-19: qty 500

## 2022-03-19 MED ORDER — LOSARTAN POTASSIUM-HCTZ 100-12.5 MG PO TABS: 1.0000 | ORAL_TABLET | Freq: Every day | ORAL | Status: DC

## 2022-03-19 MED ORDER — MIDAZOLAM HCL 2 MG/2ML IJ SOLN
INTRAMUSCULAR | Status: AC
  Filled 2022-03-19: qty 2

## 2022-03-19 MED ORDER — MIDAZOLAM HCL 2 MG/2ML IJ SOLN
INTRAMUSCULAR | Status: DC | PRN
  Administered 2022-03-19: 2 mg via INTRAVENOUS

## 2022-03-19 MED ORDER — SODIUM CHLORIDE 0.9 % WEIGHT BASED INFUSION: 3.0000 mL/kg/h | INTRAVENOUS | Status: DC

## 2022-03-19 MED ORDER — ONDANSETRON HCL 4 MG/2ML IJ SOLN
4.0000 mg | Freq: Four times a day (QID) | INTRAMUSCULAR | Status: DC | PRN
  Administered 2022-03-19: 4 mg via INTRAVENOUS

## 2022-03-19 MED ORDER — HYDROCHLOROTHIAZIDE 12.5 MG PO TABS
12.5000 mg | ORAL_TABLET | Freq: Every day | ORAL | Status: DC
  Administered 2022-03-20: 12.5 mg via ORAL
  Filled 2022-03-19: qty 1

## 2022-03-19 MED ORDER — ISOSORBIDE MONONITRATE ER 30 MG PO TB24
30.0000 mg | ORAL_TABLET | Freq: Every day | ORAL | Status: DC
  Administered 2022-03-19 – 2022-03-20 (×2): 30 mg via ORAL
  Filled 2022-03-19 (×2): qty 1

## 2022-03-19 MED ORDER — SODIUM CHLORIDE 0.9 % IV SOLN: 250.0000 mL | INTRAVENOUS | Status: DC | PRN

## 2022-03-19 MED ORDER — ASPIRIN 81 MG PO CHEW: 81.0000 mg | CHEWABLE_TABLET | ORAL | Status: DC

## 2022-03-19 MED ORDER — SODIUM CHLORIDE 0.9% FLUSH: 3.0000 mL | Freq: Two times a day (BID) | INTRAVENOUS | Status: DC

## 2022-03-19 MED ORDER — HEPARIN SODIUM (PORCINE) 1000 UNIT/ML IJ SOLN
INTRAMUSCULAR | Status: DC | PRN
  Administered 2022-03-19: 5000 [IU] via INTRAVENOUS

## 2022-03-19 MED ORDER — SODIUM CHLORIDE 0.9 % WEIGHT BASED INFUSION: 1.0000 mL/kg/h | INTRAVENOUS | Status: AC

## 2022-03-19 MED ORDER — EZETIMIBE 10 MG PO TABS
10.0000 mg | ORAL_TABLET | Freq: Every day | ORAL | Status: DC
  Administered 2022-03-19 – 2022-03-20 (×2): 10 mg via ORAL
  Filled 2022-03-19 (×2): qty 1

## 2022-03-19 MED ORDER — MOMETASONE FURO-FORMOTEROL FUM 200-5 MCG/ACT IN AERO
2.0000 | INHALATION_SPRAY | Freq: Two times a day (BID) | RESPIRATORY_TRACT | Status: DC
  Administered 2022-03-19: 2 via RESPIRATORY_TRACT
  Filled 2022-03-19: qty 8.8

## 2022-03-19 MED ORDER — COLCHICINE 0.6 MG PO TABS
0.6000 mg | ORAL_TABLET | Freq: Two times a day (BID) | ORAL | Status: DC
  Administered 2022-03-19 – 2022-03-20 (×3): 0.6 mg via ORAL
  Filled 2022-03-19 (×3): qty 1

## 2022-03-19 MED ORDER — ASPIRIN 81 MG PO CHEW
324.0000 mg | CHEWABLE_TABLET | Freq: Once | ORAL | Status: AC
  Administered 2022-03-19: 324 mg via ORAL
  Filled 2022-03-19: qty 4

## 2022-03-19 MED ORDER — DIAZEPAM 5 MG PO TABS: 5.0000 mg | ORAL_TABLET | ORAL | Status: DC | PRN

## 2022-03-19 MED ORDER — APIXABAN 5 MG PO TABS
5.0000 mg | ORAL_TABLET | Freq: Two times a day (BID) | ORAL | Status: DC
  Administered 2022-03-19 – 2022-03-20 (×2): 5 mg via ORAL
  Filled 2022-03-19 (×2): qty 1

## 2022-03-19 MED ORDER — DILTIAZEM HCL ER COATED BEADS 180 MG PO CP24
180.0000 mg | ORAL_CAPSULE | Freq: Every day | ORAL | Status: DC
  Administered 2022-03-20: 180 mg via ORAL
  Filled 2022-03-19: qty 1

## 2022-03-19 MED ORDER — LIDOCAINE HCL (PF) 1 % IJ SOLN
INTRAMUSCULAR | Status: DC | PRN
  Administered 2022-03-19: 2 mL

## 2022-03-19 MED ORDER — ATORVASTATIN CALCIUM 10 MG PO TABS
20.0000 mg | ORAL_TABLET | Freq: Every day | ORAL | Status: DC
  Administered 2022-03-19 – 2022-03-20 (×2): 20 mg via ORAL
  Filled 2022-03-19 (×2): qty 2

## 2022-03-19 SURGICAL SUPPLY — 19 items
BALLN SAPPHIRE 2.0X20 (BALLOONS) ×1
BALLOON SAPPHIRE 2.0X20 (BALLOONS) IMPLANT
BAND CMPR LRG ZPHR (HEMOSTASIS) ×1
BAND ZEPHYR COMPRESS 30 LONG (HEMOSTASIS) IMPLANT
CATH INFINITI JR4 5F (CATHETERS) IMPLANT
CATH LAUNCHER 6FR JR4 (CATHETERS) IMPLANT
CATH OPTITORQUE TIG 4.0 5F (CATHETERS) IMPLANT
DEVICE RAD COMP TR BAND LRG (VASCULAR PRODUCTS) IMPLANT
ELECT DEFIB PAD ADLT CADENCE (PAD) IMPLANT
GLIDESHEATH SLEND A-KIT 6F 22G (SHEATH) IMPLANT
GUIDEWIRE INQWIRE 1.5J.035X260 (WIRE) IMPLANT
INQWIRE 1.5J .035X260CM (WIRE) ×1
KIT ENCORE 26 ADVANTAGE (KITS) IMPLANT
KIT HEART LEFT (KITS) ×1 IMPLANT
PACK CARDIAC CATHETERIZATION (CUSTOM PROCEDURE TRAY) ×1 IMPLANT
SHEATH PROBE COVER 6X72 (BAG) IMPLANT
TRANSDUCER W/STOPCOCK (MISCELLANEOUS) ×1 IMPLANT
TUBING CIL FLEX 10 FLL-RA (TUBING) ×1 IMPLANT
WIRE COUGAR XT STRL 190CM (WIRE) IMPLANT

## 2022-03-19 NOTE — ED Provider Notes (Signed)
[facility] EMERGENCY DEPT Provider Note   CSN: [id number] Arrival date & time: 03/19/22  0148     History  Chief Complaint  Patient presents with   Chest Pain    Michele Reyes is a 71 y.o. female.  She is here with substernal chest pain that started around 9 PM last night.  No radiation no diaphoresis.  Mild shortness of breath.  She is out of the lung infection.  She presented here and initial EKG and troponin were unremarkable.  She was just reevaluated and had worsening of her pain now radiating into back and shoulders.  Repeat EKG done and handed to me at 820.  Appears to be having inferior STEMI.  Moved back to room on emergently.  Patient states she has never had this pain before.  No prior history of heart disease no diabetes.  Chronic cough.  Rates the pain as 10 out of 10.  The history is provided by the patient.  Chest Pain Pain location:  Substernal area Pain quality: aching   Pain radiates to:  Upper back, L shoulder and R shoulder Pain severity:  Severe Onset quality:  Gradual Duration:  10 hours Timing:  Constant Progression:  Worsening Chronicity:  New Context: at rest   Relieved by:  None tried Worsened by:  Nothing Ineffective treatments:  None tried Associated symptoms: cough and shortness of breath   Associated symptoms: no abdominal pain, no diaphoresis, no fever, no nausea and no vomiting        Home Medications Prior to Admission medications   Medication Sig Start Date End Date Taking? Authorizing Provider  acetaminophen (TYLENOL) 325 MG tablet Take 650 mg by mouth every 6 (six) hours as needed.    [provider]  albuterol (PROAIR HFA) 108 (90 BASE) MCG/ACT inhaler Inhale 2 puffs into the lungs every 6 (six) hours as needed for wheezing. 03/06/15   Chesley Mires, MD  albuterol (PROVENTIL) (2.5 MG/3ML) 0.083% nebulizer solution Take 3 mLs (2.5 mg total) by nebulization every 6 (six) hours as needed for wheezing or shortness  of breath. 03/14/20   Truddie Hidden, MD  budesonide-formoterol [facility]) 160-4.5 MCG/ACT inhaler Inhale 2 puffs into the lungs 2 (two) times daily. 02/10/20   Martyn Ehrich, NP  Cholecalciferol (VITAMIN D-3) 25 MCG (1000 UT) CAPS Take 1,000 Units by mouth daily with breakfast.    [provider]  ezetimibe (ZETIA) 10 MG tablet Take 10 mg by mouth daily.    [provider]  ibuprofen (ADVIL,MOTRIN) 200 MG tablet Take 400-600 mg by mouth every 6 (six) hours as needed for pain.    [provider]  losartan-hydrochlorothiazide (HYZAAR) 100-12.5 MG tablet Take 1 tablet by mouth daily.    [provider]  montelukast (SINGULAIR) 10 MG tablet Take 1 tablet (10 mg total) by mouth at bedtime. 02/10/20   Martyn Ehrich, NP  Multiple Vitamins-Minerals (WOMENS ONE DAILY) TABS Take 1 tablet by mouth daily.    [provider]  NON FORMULARY Take by mouth See admin instructions. "Nature's Own" shake powder- Mix 1 shake and drink once a day    [provider]  omeprazole (PRILOSEC) 40 MG capsule Take 40 mg by mouth daily with breakfast. 10/25/19   [provider]  saccharomyces boulardii (FLORASTOR) 250 MG capsule Take 250 mg by mouth 2 (two) times daily.    [provider]  Spacer/Aero-Holding Chambers (AEROCHAMBER MV) inhaler Use as instructed 01/14/13   Chesley Mires, MD  TURMERIC PO Take by mouth See admin instructions. "Golden Milkshake"- Mix powder into soup or a shake and drink once a day    [provider]      Allergies    Azithromycin, Levofloxacin, Penicillin g, Penicillins, Sulfa antibiotics, and Sulfamethoxazole-trimethoprim    Review of Systems   Review of Systems  Constitutional:  Negative for diaphoresis and fever.  HENT:  Negative for sore throat.   Respiratory:  Positive for cough and shortness of breath.   Cardiovascular:  Positive for chest pain.  Gastrointestinal:  Negative for abdominal pain,  nausea and vomiting.  Genitourinary:  Negative for dysuria.  Skin:  Negative for rash.    Physical Exam Updated Vital Signs BP 128/67 (BP Location: Right Arm)   Pulse 73   Temp 98.2 F (36.8 C) (Oral)   Resp 15   Ht '5\' 4"'$  (1.626 m)   Wt 108.9 kg   SpO2 97%   BMI 41.20 kg/m  Physical Exam Vitals and nursing note reviewed.  Constitutional:      General: She is not in acute distress.    Appearance: She is well-developed.  HENT:     Head: Normocephalic and atraumatic.  Eyes:     Conjunctiva/sclera: Conjunctivae normal.  Cardiovascular:     Rate and Rhythm: Normal rate and regular rhythm.     Heart sounds: Normal heart sounds. No murmur heard. Pulmonary:     Effort: Pulmonary effort is normal. No respiratory distress.     Breath sounds: Normal breath sounds.  Abdominal:     Palpations: Abdomen is soft.     Tenderness: There is no abdominal tenderness.  Musculoskeletal:        General: No swelling. Normal range of motion.     Cervical back: Neck supple.     Right lower leg: No tenderness. No edema.     Left lower leg: No tenderness. No edema.  Skin:    General: Skin is warm and dry.     Capillary Refill: Capillary refill takes less than 2 seconds.  Neurological:     General: No focal deficit present.     Mental Status: She is alert.  Psychiatric:        Mood and Affect: Mood normal.     ED Results / Procedures / Treatments   Labs (all labs ordered are listed, but only abnormal results are displayed) Labs Reviewed  BASIC METABOLIC PANEL - Abnormal; Notable for the following components:      Result Value   Glucose, Bld 110 (*)    BUN 27 (*)    All other components within normal limits  CBC - Abnormal; Notable for the following components:   WBC 10.6 (*)    All other components within normal limits  RESP PANEL BY RT-PCR (RSV, FLU A&B, COVID)  [id number]  PROTIME-INR  APTT  LIPID PANEL  HEPATIC FUNCTION PANEL  HEMOGLOBIN A1C  LIPOPROTEIN A (LPA)  POCT ACTIVATED  CLOTTING TIME  TROPONIN I (HIGH SENSITIVITY)  TROPONIN I (HIGH SENSITIVITY)    EKG EKG Interpretation  Date/Time:  Wednesday March 19 2022 01:59:28 EST Ventricular Rate:  84 PR Interval:  152 QRS Duration: 78 QT Interval:  368 QTC Calculation: 434 R Axis:   -11 Text Interpretation: Normal sinus rhythm Normal ECG When compared with ECG of 16-Apr-2021 13:43, No significant change was found Interpretation limited secondary to artifact Confirmed by Ripley Fraise ([id number]) on 03/19/2022 2:15:57 AM  Radiology DG Chest 2 View  Result Date: 03/19/2022 CLINICAL  DATA:  Chest pain EXAM: CHEST - 2 VIEW COMPARISON:  None Available. FINDINGS: Lungs are well expanded, symmetric, and clear. No pneumothorax or pleural effusion. Cardiac size within normal limits. Pulmonary vascularity is normal. Osseous structures are age-appropriate. No acute bone abnormality. IMPRESSION: No active cardiopulmonary disease. Electronically Signed   By: Fidela Salisbury M.D.   On: 03/19/2022 02:27    Procedures .Critical Care  Performed by: Hayden Rasmussen, MD Authorized by: Hayden Rasmussen, MD   Critical care provider statement:    Critical care time (minutes):  45   Critical care time was exclusive of:  Separately billable procedures and treating other patients   Critical care was necessary to treat or prevent imminent or life-threatening deterioration of the following conditions:  Cardiac failure   Critical care was time spent personally by me on the following activities:  Development of treatment plan with patient or surrogate, discussions with consultants, evaluation of patient's response to treatment, examination of patient, obtaining history from patient or surrogate, ordering and performing treatments and interventions, ordering and review of laboratory studies, ordering and review of radiographic studies, pulse oximetry, re-evaluation of patient's condition and review of old charts   I assumed direction  of critical care for this patient from another provider in my specialty: no       Medications Ordered in ED Medications  mometasone-formoterol (DULERA) 200-5 MCG/ACT inhaler 2 puff (2 puffs Inhalation Given 03/19/22 1326)  ezetimibe (ZETIA) tablet 10 mg (10 mg Oral Given 03/19/22 1634)  saccharomyces boulardii (FLORASTOR) capsule 250 mg (has no administration in time range)  acetaminophen (TYLENOL) tablet 650 mg (has no administration in time range)  ondansetron (ZOFRAN) injection 4 mg (4 mg Intravenous Given 03/19/22 1341)  0.9% sodium chloride infusion (has no administration in time range)    Followed by  0.9% sodium chloride infusion (has no administration in time range)  sodium chloride flush (NS) 0.9 % injection 3 mL (has no administration in time range)  sodium chloride flush (NS) 0.9 % injection 3 mL (has no administration in time range)  0.9 %  sodium chloride infusion (has no administration in time range)  aspirin chewable tablet 81 mg (has no administration in time range)  0.9% sodium chloride infusion (1 mL/kg/hr  108.9 kg Intravenous Infusion Verify 03/19/22 1536)  sodium chloride flush (NS) 0.9 % injection 3 mL (has no administration in time range)  sodium chloride flush (NS) 0.9 % injection 3 mL (has no administration in time range)  0.9 %  sodium chloride infusion (has no administration in time range)  diazepam (VALIUM) tablet 5 mg (has no administration in time range)  colchicine tablet 0.6 mg (0.6 mg Oral Given 03/19/22 1333)  atorvastatin (LIPITOR) tablet 20 mg (20 mg Oral Given 03/19/22 1333)  isosorbide mononitrate (IMDUR) 24 hr tablet 30 mg (30 mg Oral Given 03/19/22 1333)  diltiazem (CARDIZEM CD) 24 hr capsule 180 mg (has no administration in time range)  losartan (COZAAR) tablet 100 mg (has no administration in time range)    And  hydrochlorothiazide (HYDRODIURIL) tablet 12.5 mg (has no administration in time range)  apixaban (ELIQUIS) tablet 5 mg (has no  administration in time range)  aspirin chewable tablet 324 mg (324 mg Oral Given 03/19/22 0832)  heparin injection 6,550 Units (6,550 Units Intravenous Given 03/19/22 0832)  morphine (PF) 4 MG/ML injection 4 mg (4 mg Intravenous Given 03/19/22 0837)  diltiazem (CARDIZEM) injection 20 mg (20 mg Intravenous Given 03/19/22 1419)    ED Course/  Medical Decision Making/ A&P Clinical Course as of 03/19/22 1809  Wed Mar 19, 2022  0832 Code STEMI paged out at 57.  Heparin and aspirin initiated. [MB]  [phone] Discussed with Dr. Einar Gip who is excepting patient to Cath Lab.  [facility] aware and EMS called for transport. [MB]    Clinical Course User Index [MB] Hayden Rasmussen, MD                           Medical Decision Making Amount and/or Complexity of Data Reviewed Labs: ordered. Radiology: ordered.  Risk OTC drugs. Prescription drug management.   This patient complains of chest pain radiation to the shoulders and back; this involves an extensive number of treatment Options and is a complaint that carries with it a high risk of complications and morbidity. The differential includes ACS, pneumonia, PE, pneumothorax, vascular, reflux, musculoskeletal  I ordered, reviewed and interpreted labs, which included CBC with mildly elevated white count normal hemoglobin, chemistries unremarkable, troponin unremarkable I ordered medication oral aspirin IV heparin IV morphine and reviewed PMP when indicated. I ordered imaging studies which included chest x-ray and I independently    visualized and interpreted imaging which showed no acute findings Previous records obtained and reviewed in epic no recent admissions I consulted Dr. Einar Gip on-call cardiology and discussed lab and imaging findings and discussed disposition.  Cardiac monitoring reviewed, normal sinus rhythm Social determinants considered, no significant barriers Critical Interventions: Initiation of STEMI protocol  After the  interventions stated above, I reevaluated the patient and found patient still to be in significant distress Admission and further testing considered, she is being transferred over to [facility] for further evaluation by cardiology.  Patient in agreement with plan.         Final Clinical Impression(s) / ED Diagnoses Final diagnoses:  ST elevation myocardial infarction (STEMI), unspecified artery [facility])    Rx / DC Orders ED Discharge Orders     None         Hayden Rasmussen, MD 03/19/22 [phone]

## 2022-03-19 NOTE — Progress Notes (Signed)
  Echocardiogram 2D Echocardiogram has been performed.  Michele Reyes M 03/19/2022, 2:33 PM

## 2022-03-19 NOTE — Progress Notes (Addendum)
Patient was given inhaler and heart rate increased to 130's.  Rhythm changed to a fib rvr per EKG. All other medications were given and Dr. Einar Gip was notified about EKG results.  20 mg of Diltiazem was given. 6E nurse notified of changes. Heart rate went to the 80-90 range. Rhythm remained. Patient monitored for 30 min. MD notified. Patient Transferred.

## 2022-03-19 NOTE — Progress Notes (Signed)
ANTICOAGULATION CONSULT NOTE - Initial Consult  Pharmacy Consult for apixaban Indication: atrial fibrillation  Allergies  Allergen Reactions   Azithromycin Hives and Rash   Levofloxacin Hives        Penicillin G Hives        Penicillins Hives   Sulfa Antibiotics Hives   Sulfamethoxazole-Trimethoprim Hives and Rash    Patient Measurements: Height: '5\' 4"'$  (162.6 cm) Weight: 108.9 kg (240 lb) IBW/kg (Calculated) : 54.7 Heparin Dosing Weight: 80.5 kg   Vital Signs: Temp: 99.1 F (37.3 C) (12/27 1520) Temp Source: Oral (12/27 1520) BP: 141/75 (12/27 1520) Pulse Rate: 128 (12/27 1520)  Labs: Recent Labs    03/19/22 0229 03/19/22 0828 03/19/22 0829  HGB 13.9  --   --   HCT 42.5  --   --   PLT 248  --   --   APTT  --   --  29  LABPROT  --   --  13.0  INR  --   --  1.0  CREATININE 0.85  --   --   TROPONINIHS 5 3  --     Estimated Creatinine Clearance: 75.3 mL/min (by C-G formula based on SCr of 0.85 mg/dL).   Medical History: Past Medical History:  Diagnosis Date   Asthma    COPD (chronic obstructive pulmonary disease) (HCC)    GERD (gastroesophageal reflux disease)    Hyperlipidemia    Hypertension    Psoriasis    Rhinitis     Medications:  Scheduled:   [START ON 03/20/2022] aspirin  81 mg Oral Pre-Cath   atorvastatin  20 mg Oral Daily   colchicine  0.6 mg Oral BID   [START ON 03/20/2022] diltiazem  180 mg Oral Daily   ezetimibe  10 mg Oral Daily   [START ON 03/20/2022] losartan  100 mg Oral Daily   And   [START ON 03/20/2022] hydrochlorothiazide  12.5 mg Oral Daily   isosorbide mononitrate  30 mg Oral Daily   mometasone-formoterol  2 puff Inhalation BID   saccharomyces boulardii  250 mg Oral BID   sodium chloride flush  3 mL Intravenous Q12H   sodium chloride flush  3 mL Intravenous Q12H    Assessment: 38 yof presenting with chest pain - underwent cardiac cath showing clean coronaries, concern for acute pericarditis.   Hgb 13.9, plt 248.  Small hematoma at wrist. Confirmed with MD, okay to restart tonight. Given age<80, Scr<1.5, and wt>60 kg - qualifies for full dose.  Goal of Therapy:  Monitor platelets by anticoagulation protocol: Yes   Plan:  Apixaban 5 mg BID  Monitor CBC and for s/sx of bleeding  Antonietta Jewel, PharmD, BCCCP Clinical Pharmacist  Phone: [phone] 03/19/2022 4:19 PM  Please check AMION for all [place] phone numbers After 10:00 PM, call [place] [phone]

## 2022-03-19 NOTE — Progress Notes (Signed)
TR band removed  Site level 1  Guaze and Tegaderm placed. Patient hemodynamically stable

## 2022-03-19 NOTE — H&P (Signed)
CARDIOLOGY ADMIT NOTE   Patient ID: Michele Reyes MRN: [id number] DOB/AGE: 69-Oct-1952 69 y.o.  Admit date: 03/19/2022 Primary Physician:  [name], PA-C  Patient ID: Michele Reyes, female    DOB: 1950-12-14, 71 y.o.   MRN: [id number]  Chief Complaint  Patient presents with   Code STEMI   HPI:    Michele Reyes  is a 71 y.o. Caucasian female patient with history of reactive airway disease, primary hypertension, hypercholesterolemia, eating disorder and morbid obesity who had been doing well until 6 PM last evening started having chest tightness.  She presented to [place] emergency department, where initial EKG and troponin were negative.  While she was being observed, she again had recurrence of chest pain with radiation to the shoulders and back and at 8:17 AM today that is 03/19/2022, repeat EKG revealed ST elevation in the inferior lateral leads.  She is now being emergently rushed to [facility] for possible cardiac catheterization.  Presently still having chest discomfort.  No diaphoresis, has mild associated shortness of breath.  After she received morphine she did feel nauseous and threw up small amount.  Past Medical History:  Diagnosis Date   Asthma    COPD (chronic obstructive pulmonary disease) (HCC)    GERD (gastroesophageal reflux disease)    Hypertension    Psoriasis    Rhinitis    Past Surgical History:  Procedure Laterality Date   COLONOSCOPY     NASAL SINUS SURGERY  03/25/1999   VENTRAL HERNIA REPAIR N/A 04/18/2021   Procedure: OPEN VENTRAL HERNIA REPAIR WITH MESH;  Surgeon: Donnie Mesa, MD;  Location: [place];  Service: General;  Laterality: N/A;   Social History   Socioeconomic History   Marital status: Single    Spouse name: Not on file   Number of children: Not on file   Years of education: Not on file   Highest education level: Not on file  Occupational History   Not on file  Tobacco Use   Smoking  status: Former    Packs/day: 2.00    Years: 10.00    Total pack years: 20.00    Types: Cigarettes    Quit date: 03/24/1993    Years since quitting: 29.0   Smokeless tobacco: Never  Substance and Sexual Activity   Alcohol use: Yes    Comment: occasionally   Drug use: No   Sexual activity: Not Currently    Birth control/protection: Post-menopausal  Other Topics Concern   Not on file  Social History Narrative   Not on file   Social Determinants of Health   Financial Resource Strain: Not on file  Food Insecurity: Not on file  Transportation Needs: Not on file  Physical Activity: Not on file  Stress: Not on file  Social Connections: Not on file  Intimate Partner Violence: Not on file   Family History  Problem Relation Age of Onset   Allergies Brother        x2   Allergies Sister    Hypertension Mother    Heart attack Mother    Colon cancer Mother    Hypertension Father    Heart attack Father    Heart failure Father    Breast cancer Sister     ROS  Review of Systems  Cardiovascular:  Positive for chest pain and dyspnea on exertion. Negative for leg swelling.   Objective      03/19/2022    8:30 AM 03/19/2022  8:26 AM 03/19/2022    8:14 AM  Vitals with BMI  Systolic 149  702  Diastolic 90  67  Pulse 76 73 82    Physical Exam Constitutional:      Appearance: She is morbidly obese.  Neck:     Vascular: No carotid bruit or JVD.  Cardiovascular:     Rate and Rhythm: Normal rate and regular rhythm.     Pulses: Intact distal pulses.     Heart sounds: Normal heart sounds. No murmur heard.    No gallop.  Pulmonary:     Effort: Pulmonary effort is normal.     Breath sounds: Normal breath sounds.  Abdominal:     General: Bowel sounds are normal.     Palpations: Abdomen is soft.  Musculoskeletal:     Right lower leg: No edema.     Left lower leg: No edema.    Laboratory examination:   Recent Labs    03/19/22 0229  NA 138  K 3.8  CL 105  CO2 24   GLUCOSE 110*  BUN 27*  CREATININE 0.85  CALCIUM 9.2  GFRNONAA >60       Latest Ref Rng & Units 03/19/2022    2:29 AM 03/17/2021    1:14 PM 03/13/2020   10:34 PM  CMP  Glucose 70 - 99 mg/dL 110  84  134   BUN 8 - 23 mg/dL '27  20  15   '$ Creatinine 0.44 - 1.00 mg/dL 0.85  0.76  0.82   Sodium 135 - 145 mmol/L 138  139  134   Potassium 3.5 - 5.1 mmol/L 3.8  3.5  3.4   Chloride 98 - 111 mmol/L 105  103  97   CO2 22 - 32 mmol/L '24  24  24   '$ Calcium 8.9 - 10.3 mg/dL 9.2  9.2  9.1   Total Protein 6.5 - 8.1 g/dL  7.0    Total Bilirubin 0.3 - 1.2 mg/dL  0.7    Alkaline Phos 38 - 126 U/L  83    AST 15 - 41 U/L  15    ALT 0 - 44 U/L  13        Latest Ref Rng & Units 03/19/2022    2:29 AM 03/17/2021    1:14 PM 03/13/2020   10:34 PM  CBC  WBC 4.0 - 10.5 K/uL 10.6  13.8  10.1   Hemoglobin 12.0 - 15.0 g/dL 13.9  14.2  14.4   Hematocrit 36.0 - 46.0 % 42.5  43.0  42.4   Platelets 150 - 400 K/uL 248  307  290    Lipid Panel  No results found for: "CHOL", "TRIG", "HDL", "CHOLHDL", "VLDL", "LDLCALC", "LDLDIRECT" HEMOGLOBIN A1C No results found for: "HGBA1C", "MPG" TSH No results for input(s): "TSH" in the last 8760 hours. BNP (last 3 results) No results for input(s): "BNP" in the last 8760 hours. Cardiac Panel (last 3 results) Recent Labs    03/19/22 0229  TROPONINIHS 5     Medications and allergies   Allergies  Allergen Reactions   Azithromycin Hives and Rash   Levofloxacin Hives        Penicillin G Hives        Penicillins Hives   Sulfa Antibiotics Hives   Sulfamethoxazole-Trimethoprim Hives and Rash     Prior to Admission medications   Medication Sig Start Date End Date Taking? Authorizing Provider  acetaminophen (TYLENOL) 325 MG tablet Take 650 mg by mouth every 6 (  six) hours as needed.    [provider]  albuterol (PROAIR HFA) 108 (90 BASE) MCG/ACT inhaler Inhale 2 puffs into the lungs every 6 (six) hours as needed for wheezing. 03/06/15   Chesley Mires, MD  albuterol (PROVENTIL) (2.5 MG/3ML) 0.083% nebulizer solution Take 3 mLs (2.5 mg total) by nebulization every 6 (six) hours as needed for wheezing or shortness of breath. 03/14/20   Truddie Hidden, MD  budesonide-formoterol [facility]) 160-4.5 MCG/ACT inhaler Inhale 2 puffs into the lungs 2 (two) times daily. 02/10/20   Martyn Ehrich, NP  Cholecalciferol (VITAMIN D-3) 25 MCG (1000 UT) CAPS Take 1,000 Units by mouth daily with breakfast.    [provider]  ezetimibe (ZETIA) 10 MG tablet Take 10 mg by mouth daily.    [provider]  ibuprofen (ADVIL,MOTRIN) 200 MG tablet Take 400-600 mg by mouth every 6 (six) hours as needed for pain.    [provider]  losartan-hydrochlorothiazide (HYZAAR) 100-12.5 MG tablet Take 1 tablet by mouth daily.    [provider]  montelukast (SINGULAIR) 10 MG tablet Take 1 tablet (10 mg total) by mouth at bedtime. 02/10/20   Martyn Ehrich, NP  Multiple Vitamins-Minerals (WOMENS ONE DAILY) TABS Take 1 tablet by mouth daily.    [provider]  NON FORMULARY Take by mouth See admin instructions. "Nature's Own" shake powder- Mix 1 shake and drink once a day    [provider]  omeprazole (PRILOSEC) 40 MG capsule Take 40 mg by mouth daily with breakfast. 10/25/19   [provider]  saccharomyces boulardii (FLORASTOR) 250 MG capsule Take 250 mg by mouth 2 (two) times daily.    [provider]  Spacer/Aero-Holding Chambers (AEROCHAMBER MV) inhaler Use as instructed 01/14/13   Chesley Mires, MD  TURMERIC PO Take by mouth See admin instructions. "Golden Milkshake"- Mix powder into soup or a shake and drink once a day    [provider]     sodium chloride 10 mL/hr at 03/19/22 0962    Current Outpatient Medications  Medication Instructions   acetaminophen (TYLENOL) 650 mg, Oral, Every 6 hours PRN   albuterol (PROAIR HFA) 108 (90 BASE) MCG/ACT inhaler 2 puffs, Inhalation,  Every 6 hours PRN   albuterol (PROVENTIL) 2.5 mg, Nebulization, Every 6 hours PRN   budesonide-formoterol (SYMBICORT) 160-4.5 MCG/ACT inhaler 2 puffs, Inhalation, 2 times daily   ezetimibe (ZETIA) 10 mg, Oral, Daily   ibuprofen (ADVIL) 400-600 mg, Oral, Every 6 hours PRN   losartan-hydrochlorothiazide (HYZAAR) 100-12.5 MG tablet 1 tablet, Oral, Daily   montelukast (SINGULAIR) 10 mg, Oral, Daily at bedtime   Multiple Vitamins-Minerals (WOMENS ONE DAILY) TABS 1 tablet, Oral, Daily   NON FORMULARY Oral, See admin instructions, "Nature's Own" shake powder- Mix 1 shake and drink once a day   omeprazole (PRILOSEC) 40 mg, Oral, Daily with breakfast   saccharomyces boulardii (FLORASTOR) 250 mg, Oral, 2 times daily   Spacer/Aero-Holding Chambers (AEROCHAMBER MV) inhaler Use as instructed   TURMERIC PO Oral, See admin instructions, "Golden Milkshake"- Mix powder into soup or a shake and drink once a day   Vitamin D-3 1,000 Units, Oral, Daily with breakfast   Radiology:  DG Chest 2 View  Result Date: 03/19/2022 CLINICAL DATA:  Chest pain EXAM: CHEST - 2 VIEW COMPARISON:  None Available. FINDINGS: Lungs are well expanded, symmetric, and clear. No pneumothorax or pleural effusion. Cardiac size within normal limits. Pulmonary vascularity is normal. Osseous structures are age-appropriate. No acute bone  abnormality. IMPRESSION: No active cardiopulmonary disease. Electronically Signed   By: Fidela Salisbury M.D.   On: 03/19/2022 02:27    Cardiac Studies:   EKG 03/19/2022: Initial EKG from 03/18/2022 revealed normal sinus rhythm.  8:17 AM on 03/19/2022 (this morning) repeat EKG with chest discomfort reveals ST elevation in the inferior and lateral leads suggestive of acute STEMI.  Assessment   1.  Acute inferolateral STEMI 2.  Primary hypertension 3.  Hypercholesterolemia 4.  Reactive airway disease 5.  Morbid obesity  Recommendations:   JANYLAH BELGRAVE is a 71 y.o. Caucasian female patient with  hypertension, hyperlipidemia, eating disorder, morbid obesity, reactive airway disease who presents with acute onset chest discomfort that started last night with normal EKG and serum troponin, this morning around 8:17 AM well-placed elevation in inferolateral leads.  Patient is not in any acute distress but EKG is slightly more worse upon arrival.  No reciprocal significant changes.  No significant arrhythmias.  Will take the patient to the cardiac catheterization lab, patient is aware of the risks and benefits associated with cardiac catheterization.  Will control her hypertension and hypercholesterolemia very aggressively, will wait upon to reevaluate her coronary anatomy.  Hopefully this will also improve her compliance with diet for weight loss.  This was a 42-45-minute encounter in total care for acute evolving ST elevation MI and coordination of care and evaluation of her medical records, external records and reconciliation of medications and management of life-threatening ACS.   Adrian Prows, MD, [facility] 03/19/2022, 9:11 AM Office: [phone] Fax: [phone] Pager: [phone]

## 2022-03-19 NOTE — ED Triage Notes (Signed)
Chest pain starting around 9 pm last evening. Denies shortness of breath.

## 2022-03-19 NOTE — ED Notes (Signed)
Pt transferred to Cath Lab via [facility] at this time

## 2022-03-19 NOTE — Progress Notes (Signed)
Received report at bedside, assessed pt's right radial site- noted swelling, bruising, & some firm places on the right forearm that had previously been marked. Manual pressure was held for approximately 5 minutes with resolved symptoms. Pt voiced concerns in regards to [facility] inhaler, stated that she felt the medication sent her into atrial fibrillation. Attempted to notify Einar Gip, MD. RN's from the Cath Lab arrived to assess the site at bedside, deemed the site to be stable. Pt educated on restricting the amount of movement for that extremity overnight and keeping extremity elevated. Pt refused nighttime dose of Dulera.    Elaina Hoops, RN

## 2022-03-19 NOTE — ED Notes (Signed)
08:41 [facility] called for transport due to no [facility] trucks.-ABB(NS)

## 2022-03-19 NOTE — Interval H&P Note (Signed)
History and Physical Interval Note:  03/19/2022 9:38 AM  Michele Reyes  has presented today for surgery, with the diagnosis of stemi.  The various methods of treatment have been discussed with the patient and family. After consideration of risks, benefits and other options for treatment, the patient has consented to  Procedure(s): LEFT HEART CATH AND CORONARY ANGIOGRAPHY (N/A) and possible coronary intervention as a surgical intervention.  The patient's history has been reviewed, patient examined, no change in status, stable for surgery.  I have reviewed the patient's chart and labs.  Questions were answered to the patient's satisfaction.     Adrian Prows

## 2022-03-20 ENCOUNTER — Other Ambulatory Visit (HOSPITAL_COMMUNITY): Payer: Self-pay

## 2022-03-20 ENCOUNTER — Encounter (HOSPITAL_COMMUNITY): Payer: Self-pay | Admitting: Cardiology

## 2022-03-20 DIAGNOSIS — Z1152 Encounter for screening for COVID-19: Secondary | ICD-10-CM | POA: Diagnosis not present

## 2022-03-20 DIAGNOSIS — I213 ST elevation (STEMI) myocardial infarction of unspecified site: Secondary | ICD-10-CM | POA: Diagnosis not present

## 2022-03-20 DIAGNOSIS — I300 Acute nonspecific idiopathic pericarditis: Secondary | ICD-10-CM | POA: Diagnosis not present

## 2022-03-20 DIAGNOSIS — Z79899 Other long term (current) drug therapy: Secondary | ICD-10-CM | POA: Diagnosis not present

## 2022-03-20 LAB — TROPONIN I (HIGH SENSITIVITY): Troponin I (High Sensitivity): 130 ng/L (ref <18)

## 2022-03-20 LAB — HEMOGLOBIN A1C
Hgb A1c MFr Bld: 5.7 % — ABNORMAL HIGH (ref 4.8–5.6)
Mean Plasma Glucose: 117 mg/dL

## 2022-03-20 SURGERY — CARDIOVERSION
Anesthesia: General

## 2022-03-20 MED ORDER — DILTIAZEM HCL ER COATED BEADS 120 MG PO CP24: 120.0000 mg | ORAL_CAPSULE | Freq: Every day | ORAL | 2 refills | Status: DC

## 2022-03-20 MED ORDER — SODIUM CHLORIDE 0.9 % IV SOLN: INTRAVENOUS | Status: DC

## 2022-03-20 MED ORDER — ASPIRIN 81 MG PO CHEW: 81.0000 mg | CHEWABLE_TABLET | Freq: Every day | ORAL | Status: AC

## 2022-03-20 MED ORDER — COLCHICINE 0.6 MG PO TABS: 0.6000 mg | ORAL_TABLET | Freq: Two times a day (BID) | ORAL | 0 refills | Status: DC

## 2022-03-20 MED ORDER — ATORVASTATIN CALCIUM 10 MG PO TABS: 10.0000 mg | ORAL_TABLET | Freq: Every day | ORAL | 2 refills | Status: DC

## 2022-03-20 NOTE — Care Management CC44 (Signed)
Condition Code 44 Documentation Completed  Patient Details  Name: Michele Reyes MRN: [id number] Date of Birth: 1950/10/30   Condition Code 44 given:  Yes Patient signature on Condition Code 44 notice:  Yes Documentation of 2 MD's agreement:  Yes Code 44 added to claim:  Yes    Bethena Roys, RN 03/20/2022, 12:20 PM

## 2022-03-20 NOTE — Discharge Summary (Signed)
Physician Discharge Summary  Patient ID: Michele Reyes MRN: [id number] DOB/AGE: 1950-09-27 71 y.o. Michele Reyes., PA-C   Admit date: 03/19/2022 Discharge date: 03/20/2022  Primary Discharge Diagnosis Acute idiopathic pericarditis Coronary vasospasm Primary hypertension Hypercholesterolemia Type II MI, do not suspect ACS.   Significant Diagnostic Studies:  DG Chest 2 View 03/19/2022 CLINICAL DATA:  Chest pain EXAM: CHEST - 2 VIEW COMPARISON:  None Available.   FINDINGS: Lungs are well expanded, symmetric, and clear. No pneumothorax or pleural effusion. Cardiac size within normal limits. Pulmonary vascularity is normal. Osseous structures are age-appropriate. No acute bone abnormality. IMPRESSION: No active cardiopulmonary disease.  Cardiac Studies:  EKG: EKG 07/19/2021: Normal sinus rhythm at the rate of 70 bpm, normal axis, ST elevation in the inferior leads and lateral leads suggestive of colitis.  No significant change from prior EKG on admission.  Compared to EKG done on 07/18/2021 at 2 PM, atrial fibrillation no longer present.  EKG 03/19/2022: Initial EKG from 03/18/2022 revealed normal sinus rhythm.  8:17 AM on 03/19/2022 (this morning) repeat EKG with chest discomfort reveals ST elevation in the inferior and lateral leads suggestive of acute STEMI.  Echocardiogram 03/19/2022:    1. Left ventricular ejection fraction, by estimation, is 60 to 65%. Left ventricular ejection fraction by PLAX is 64 %. The left ventricle has normal function. The left ventricle has no regional wall motion abnormalities. Left ventricular diastolic  parameters are indeterminate.  2. Right ventricular systolic function is normal. The right ventricular size is normal.  3. The mitral valve is normal in structure. No evidence of mitral valve regurgitation. No evidence of mitral stenosis.  4. The aortic valve is normal in structure. Aortic valve regurgitation is not visualized. No aortic stenosis  is present.  5. The inferior vena cava is normal in size with greater than 50% respiratory variability, suggesting right atrial pressure of 3 mmHg.  Left Heart Catheterization 03/19/22:  LV: 128/7, EDP 14 mmHg.  Ao 130/67, mean 100 mmHg.  No pressure gradient across the aortic valve. LVEF normal at 55% with no wall motion abnormality, no significant mitral regurgitation. LM: Nonexistent. LAD: Separate ostia.  Large-caliber vessel.  Has mild hazy 30% stenosis extending from the proximal to the mid segment rising at the origin of a very large D1.  Proximal segment of D1 also has 30% stenosis. LCx: Dominant and a large caliber vessel.  No significant disease evident. RCA: Nondominant.  Several small branches that supply the RV branch.  There was a focal 90% stenosis in the proximal RCA.  Partial relief with intracoronary nitroglycerin. Attempted intervention: In view of ongoing chest pain, EKG changes, I did wired the right coronary artery and RCA which was partially relieved spasm after nitroglycerin after the passage of the wire was completely relieved with noted stool stenosis and a normal nondominant RCA.   Impression: Suspect acute pericarditis as etiology for her presentation with ongoing chest pain and EKG postprocedure again revealed persistent ST elevation in the inferior lateral leads with no reciprocal changes and concavity upwards suggestive of peritonitis.  Do not suspect ACS.  190 mL contrast utilized.  Hospital Course: Michele Reyes is a 71 y.o. female with history of hypertension, hyperlipidemia, obesity who presents with ongoing chest pain and inferolateral STEMI on EKG,. Hence is brought to the cardiac catheterization lab to evaluate the coronary anatomy for definitive diagnosis of CAD.    Her coronary anatomy revealed coronary spasm in the nondominant RCA, hence did not feel that her EKG  abnormalities which are much more extensive could be contributed by this abnormality as  circumflex and LAD had very mild disease.  It is felt that these changes of EKG could be related to pericarditis.  Her troponins initial 2 sets were negative and the third set was mildly elevated again this is related to pericarditis and does not correlate with extensive EKG abnormality.  She was then transferred to the floor for observation and she has not had no recurrence of chest pain.  She did not have any arrhythmias on telemetry.  Recommendations on discharge: She will be discharged on colchicine 0.6 mg p.o. twice daily for 12 weeks to prevent recurrence of peritonitis.  In view of mild coronary atherosclerosis, aspirin 81 mg daily, I also added Lipitor 10 mg daily to get LDL closer to 70.  Blood pressure and coronary spasm both could be treated with diltiazem, I started her on diltiazem CD1 120 mg daily which can be up titrated to 180 mg if needed for hypertension control and she will also continue with losartan HCT.  She will be seen in our office in 1 week to 10 days.  Discharge Exam:    03/20/2022    8:00 AM 03/20/2022    7:57 AM 03/20/2022    3:57 AM  Vitals with BMI  Systolic 824 235 361  Diastolic 68 68 71  Pulse 68  68     Physical Exam Neck:     Vascular: No carotid bruit or JVD.  Cardiovascular:     Rate and Rhythm: Normal rate and regular rhythm.     Pulses: Intact distal pulses.     Heart sounds: Normal heart sounds. No murmur heard.    No gallop.  Pulmonary:     Effort: Pulmonary effort is normal.     Breath sounds: Normal breath sounds.  Abdominal:     General: Bowel sounds are normal.     Palpations: Abdomen is soft.  Musculoskeletal:     Right lower leg: No edema.     Left lower leg: No edema.     Labs:   Lab Results  Component Value Date   WBC 10.6 (H) 03/19/2022   HGB 13.9 03/19/2022   HCT 42.5 03/19/2022   MCV 90.2 03/19/2022   PLT 248 03/19/2022    Recent Labs  Lab 03/19/22 0229 03/19/22 0830  NA 138  --   K 3.8  --   CL 105  --    CO2 24  --   BUN 27*  --   CREATININE 0.85  --   CALCIUM 9.2  --   PROT  --  7.5  BILITOT  --  0.5  ALKPHOS  --  66  ALT  --  12  AST  --  18  GLUCOSE 110*  --     Lipid Panel     Component Value Date/Time   CHOL 172 03/19/2022 0829   TRIG 103 03/19/2022 0829   HDL 60 03/19/2022 0829   CHOLHDL 2.9 03/19/2022 0829   VLDL 21 03/19/2022 0829   LDLCALC 91 03/19/2022 0829    BNP (last 3 results) No results for input(s): "BNP" in the last 8760 hours.  HEMOGLOBIN A1C Lab Results  Component Value Date   HGBA1C 5.7 (H) 03/19/2022   MPG 117 03/19/2022    Cardiac Panel (last 3 results) Recent Labs    03/19/22 0229 03/19/22 0828 03/20/22 0418  TROPONINIHS 5 3 130*    No results found for: "TSH"  FOLLOW UP PLANS AND APPOINTMENTS  Allergies as of 03/20/2022       Reactions   Azithromycin Hives, Rash   Levofloxacin Hives      Penicillin G Hives      Penicillins Hives   Sulfa Antibiotics Hives   Sulfamethoxazole-trimethoprim Hives, Rash        Medication List     TAKE these medications    acetaminophen 325 MG tablet Commonly known as: TYLENOL Take 650 mg by mouth every 6 (six) hours as needed.   AeroChamber MV inhaler Use as instructed   albuterol 108 (90 Base) MCG/ACT inhaler Commonly known as: ProAir HFA Inhale 2 puffs into the lungs every 6 (six) hours as needed for wheezing.   albuterol (2.5 MG/3ML) 0.083% nebulizer solution Commonly known as: PROVENTIL Take 3 mLs (2.5 mg total) by nebulization every 6 (six) hours as needed for wheezing or shortness of breath.   aspirin 81 MG chewable tablet Commonly known as: Aspirin Childrens Chew 1 tablet (81 mg total) by mouth daily.   atorvastatin 10 MG tablet Commonly known as: Lipitor Take 1 tablet (10 mg total) by mouth daily.   budesonide-formoterol 160-4.5 MCG/ACT inhaler Commonly known as: SYMBICORT Inhale 2 puffs into the lungs 2 (two) times daily.   colchicine 0.6 MG tablet Take 1  tablet (0.6 mg total) by mouth 2 (two) times daily.   diltiazem 120 MG 24 hr capsule Commonly known as: CARDIZEM CD Take 1 capsule (120 mg total) by mouth daily.   ezetimibe 10 MG tablet Commonly known as: ZETIA Take 10 mg by mouth daily.   ibuprofen 200 MG tablet Commonly known as: ADVIL Take 400-600 mg by mouth every 6 (six) hours as needed for pain.   losartan-hydrochlorothiazide 100-12.5 MG tablet Commonly known as: HYZAAR Take 1 tablet by mouth daily.   montelukast 10 MG tablet Commonly known as: Singulair Take 1 tablet (10 mg total) by mouth at bedtime.   NON FORMULARY Take by mouth See admin instructions. "Nature's Own" shake powder- Mix 1 shake and drink once a day   omeprazole 40 MG capsule Commonly known as: PRILOSEC Take 40 mg by mouth daily with breakfast.   saccharomyces boulardii 250 MG capsule Commonly known as: FLORASTOR Take 250 mg by mouth 2 (two) times daily.   TURMERIC PO Take by mouth See admin instructions. "Golden Milkshake"- Mix powder into soup or a shake and drink once a day   Vitamin D-3 25 MCG (1000 UT) Caps Take 1,000 Units by mouth daily with breakfast.   Womens One Daily Tabs Take 1 tablet by mouth daily.        Follow-up Information     Adrian Prows, MD Follow up on 04/03/2022.   Specialty: Cardiology Why: 04/03/2022 at 9:45 AM. Please bring all your medications to the appointment Contact information: [place] [address] [phone]         Michele Reyes., PA-C. Call in 2 week(s).   Specialty: Family Medicine Contact information: [address] [place] [address] [phone]                  Adrian Prows, MD, [facility] 03/20/2022, 10:17 AM Office: [phone]

## 2022-03-20 NOTE — Care Management Obs Status (Signed)
New Site NOTIFICATION   Patient Details  Name: Michele Reyes MRN: [id number] Date of Birth: [birth year]   Medicare Observation Status Notification Given:  Yes    Bethena Roys, RN 03/20/2022, 12:20 PM

## 2022-03-20 NOTE — TOC Benefit Eligibility Note (Signed)
Patient Teacher, English as a foreign language completed.    The patient is currently admitted and upon discharge could be taking Eliquis 5 mg.  The current 30 day co-pay is $45.00.   The patient is insured through [employer], [place] Patient Advocate Specialist [place] Patient Advocate Team Direct Number: [phone]  Fax: [phone]

## 2022-03-21 LAB — LIPOPROTEIN A (LPA): Lipoprotein (a): 208.5 nmol/L — ABNORMAL HIGH (ref <75.0)

## 2022-03-26 MED FILL — Verapamil HCl IV Soln 2.5 MG/ML: INTRAVENOUS | Qty: 2 | Status: AC

## 2022-03-26 MED FILL — Fentanyl Citrate Preservative Free (PF) Inj 100 MCG/2ML: INTRAMUSCULAR | Qty: 2 | Status: AC

## 2022-04-03 ENCOUNTER — Encounter: Payer: Self-pay | Admitting: Cardiology

## 2022-04-03 ENCOUNTER — Ambulatory Visit: Payer: PPO | Admitting: Cardiology

## 2022-04-03 VITALS — BP 135/94 | HR 79 | Ht 64.0 in | Wt 243.8 lb

## 2022-04-03 DIAGNOSIS — E7800 Pure hypercholesterolemia, unspecified: Secondary | ICD-10-CM

## 2022-04-03 DIAGNOSIS — I25111 Atherosclerotic heart disease of native coronary artery with angina pectoris with documented spasm: Secondary | ICD-10-CM

## 2022-04-03 DIAGNOSIS — I10 Essential (primary) hypertension: Secondary | ICD-10-CM

## 2022-04-03 DIAGNOSIS — I300 Acute nonspecific idiopathic pericarditis: Secondary | ICD-10-CM

## 2022-04-03 NOTE — Progress Notes (Signed)
Primary Physician/Referring:  [name], PA-C  Patient ID: Michele Reyes, female    DOB: [birth year], 72 y.o.   MRN: [id number]  No chief complaint on file.  HPI:    Michele Reyes  is a 72 y.o. female with history of hypertension, hyperlipidemia, obesity who presents with ongoing chest pain and inferolateral STEMI on EKG,. Hence is brought to the cardiac catheterization lab to evaluate the coronary anatomy for definitive diagnosis of CAD.     Her coronary anatomy revealed coronary spasm in the nondominant RCA otherwise no significant disease, EKG changes out of proportion.  She was diagnosed with acute peritonitis and discharged home on colchicine and for coronary spasm on diltiazem.  She now presents for follow-up.  Patient has had mild chest discomfort and used 1 sublingual nitroglycerin since hospital discharge but otherwise states that she is feeling well and has resumed all her activities.  She is also joined a program for weight loss.  Past Medical History:  Diagnosis Date   Asthma    COPD (chronic obstructive pulmonary disease) (HCC)    GERD (gastroesophageal reflux disease)    Hyperlipidemia    Hypertension    Psoriasis    Rhinitis    Past Surgical History:  Procedure Laterality Date   COLONOSCOPY     LEFT HEART CATH AND CORONARY ANGIOGRAPHY N/A 03/19/2022   Procedure: LEFT HEART CATH AND CORONARY ANGIOGRAPHY;  Surgeon: Adrian Prows, MD;  Location: [facility];  Service: Cardiovascular;  Laterality: N/A;   NASAL SINUS SURGERY  03/25/1999   VENTRAL HERNIA REPAIR N/A 04/18/2021   Procedure: OPEN VENTRAL HERNIA REPAIR WITH MESH;  Surgeon: Donnie Mesa, MD;  Location: [facility];  Service: General;  Laterality: N/A;   Family History  Problem Relation Age of Onset   Allergies Brother        x2   Allergies Sister    Hypertension Mother    Heart attack Mother    Colon cancer Mother    Hypertension Father    Heart attack Father    Heart  failure Father    Breast cancer Sister     Social History   Tobacco Use   Smoking status: Former    Packs/day: 2.00    Years: 10.00    Total pack years: 20.00    Types: Cigarettes    Quit date: 03/24/1993    Years since quitting: 29.0   Smokeless tobacco: Never  Substance Use Topics   Alcohol use: Yes    Comment: occasionally   Marital Status: Single  ROS  Review of Systems  Cardiovascular:  Positive for chest pain. Negative for dyspnea on exertion and leg swelling.   Objective      03/20/2022    8:00 AM 03/20/2022    7:57 AM 03/20/2022    3:57 AM  Vitals with BMI  Systolic 858 850 277  Diastolic 68 68 71  Pulse 68  68   Physical Exam Constitutional:      Appearance: She is obese.  Neck:     Vascular: No carotid bruit or JVD.  Cardiovascular:     Rate and Rhythm: Normal rate and regular rhythm.     Pulses: Intact distal pulses.     Heart sounds: Normal heart sounds. No murmur heard.    No gallop.  Pulmonary:     Effort: Pulmonary effort is normal.     Breath sounds: Normal breath sounds.  Abdominal:     General: Bowel  sounds are normal.     Palpations: Abdomen is soft.  Musculoskeletal:     Right lower leg: No edema.     Left lower leg: No edema.     Medications and allergies   Allergies  Allergen Reactions   Azithromycin Hives and Rash   Levofloxacin Hives        Penicillin G Hives        Penicillins Hives   Sulfa Antibiotics Hives   Sulfamethoxazole-Trimethoprim Hives and Rash     Medication list   Current Outpatient Medications:    acetaminophen (TYLENOL) 325 MG tablet, Take 650 mg by mouth every 6 (six) hours as needed., Disp: , Rfl:    albuterol (PROAIR HFA) 108 (90 BASE) MCG/ACT inhaler, Inhale 2 puffs into the lungs every 6 (six) hours as needed for wheezing., Disp: 1 Inhaler, Rfl: 2   albuterol (PROVENTIL) (2.5 MG/3ML) 0.083% nebulizer solution, Take 3 mLs (2.5 mg total) by nebulization every 6 (six) hours as needed for wheezing or  shortness of breath., Disp: 75 mL, Rfl: 12   aspirin (ASPIRIN CHILDRENS) 81 MG chewable tablet, Chew 1 tablet (81 mg total) by mouth daily., Disp: , Rfl:    atorvastatin (LIPITOR) 10 MG tablet, Take 1 tablet (10 mg total) by mouth daily., Disp: 30 tablet, Rfl: 2   budesonide-formoterol (SYMBICORT) 160-4.5 MCG/ACT inhaler, Inhale 2 puffs into the lungs 2 (two) times daily., Disp: 1 each, Rfl: 6   Cholecalciferol (VITAMIN D-3) 25 MCG (1000 UT) CAPS, Take 1,000 Units by mouth daily with breakfast., Disp: , Rfl:    colchicine 0.6 MG tablet, Take 1 tablet (0.6 mg total) by mouth 2 (two) times daily., Disp: 180 tablet, Rfl: 0   diltiazem (CARDIZEM CD) 120 MG 24 hr capsule, Take 1 capsule (120 mg total) by mouth daily., Disp: 30 capsule, Rfl: 2   ezetimibe (ZETIA) 10 MG tablet, Take 10 mg by mouth daily., Disp: , Rfl:    ibuprofen (ADVIL,MOTRIN) 200 MG tablet, Take 400-600 mg by mouth every 6 (six) hours as needed for pain., Disp: , Rfl:    losartan-hydrochlorothiazide (HYZAAR) 100-12.5 MG tablet, Take 1 tablet by mouth daily., Disp: , Rfl:    montelukast (SINGULAIR) 10 MG tablet, Take 1 tablet (10 mg total) by mouth at bedtime., Disp: 30 tablet, Rfl: 11   Multiple Vitamins-Minerals (WOMENS ONE DAILY) TABS, Take 1 tablet by mouth daily., Disp: , Rfl:    NON FORMULARY, Take by mouth See admin instructions. "Nature's Own" shake powder- Mix 1 shake and drink once a day, Disp: , Rfl:    omeprazole (PRILOSEC) 40 MG capsule, Take 40 mg by mouth daily with breakfast., Disp: , Rfl:    saccharomyces boulardii (FLORASTOR) 250 MG capsule, Take 250 mg by mouth 2 (two) times daily., Disp: , Rfl:    Spacer/Aero-Holding Chambers (AEROCHAMBER MV) inhaler, Use as instructed, Disp: 1 each, Rfl: 0   TURMERIC PO, Take by mouth See admin instructions. "Golden Milkshake"- Mix powder into soup or a shake and drink once a day, Disp: , Rfl:  Laboratory examination:   Recent Labs    03/19/22 0229  NA 138  K 3.8  CL 105  CO2  24  GLUCOSE 110*  BUN 27*  CREATININE 0.85  CALCIUM 9.2  GFRNONAA >60    Lab Results  Component Value Date   GLUCOSE 110 (H) 03/19/2022   NA 138 03/19/2022   K 3.8 03/19/2022   CL 105 03/19/2022   CO2 24 03/19/2022   BUN  27 (H) 03/19/2022   CREATININE 0.85 03/19/2022   GFRNONAA >60 03/19/2022   CALCIUM 9.2 03/19/2022   PROT 7.5 03/19/2022   ALBUMIN 4.4 03/19/2022   BILITOT 0.5 03/19/2022   ALKPHOS 66 03/19/2022   AST 18 03/19/2022   ALT 12 03/19/2022   ANIONGAP 9 03/19/2022      Lab Results  Component Value Date   ALT 12 03/19/2022   AST 18 03/19/2022   ALKPHOS 66 03/19/2022   BILITOT 0.5 03/19/2022       Latest Ref Rng & Units 03/19/2022    8:30 AM 03/17/2021    1:14 PM 12/08/2016    5:05 PM  Hepatic Function  Total Protein 6.5 - 8.1 g/dL 7.5  7.0  7.2   Albumin 3.5 - 5.0 g/dL 4.4  3.9  4.0   AST 15 - 41 U/L '18  15  15   '$ ALT 0 - 44 U/L '12  13  14   '$ Alk Phosphatase 38 - 126 U/L 66  83  67   Total Bilirubin 0.3 - 1.2 mg/dL 0.5  0.7  0.3   Bilirubin, Direct 0.0 - 0.2 mg/dL 0.1       Lipid Panel Recent Labs    03/19/22 0829  CHOL 172  TRIG 103  LDLCALC 91  VLDL 21  HDL 60  CHOLHDL 2.9    HEMOGLOBIN A1C Lab Results  Component Value Date   HGBA1C 5.7 (H) 03/19/2022   MPG 117 03/19/2022   No results found for: "TSH"   Radiology:   DG Chest 2 View 03/19/2022 CLINICAL DATA:  Chest pain EXAM: CHEST - 2 VIEW COMPARISON:  None Available.    FINDINGS: Lungs are well expanded, symmetric, and clear. No pneumothorax or pleural effusion. Cardiac size within normal limits. Pulmonary vascularity is normal. Osseous structures are age-appropriate. No acute bone abnormality. IMPRESSION: No active cardiopulmonary disease.  Cardiac Studies:    Echocardiogram 03/19/2022:    1. Left ventricular ejection fraction, by estimation, is 60 to 65%. Left ventricular ejection fraction by PLAX is 64 %. The left ventricle has normal function. The left ventricle has no  regional wall motion abnormalities. Left ventricular diastolic  parameters are indeterminate.  2. Right ventricular systolic function is normal. The right ventricular size is normal.  3. The mitral valve is normal in structure. No evidence of mitral valve regurgitation. No evidence of mitral stenosis.  4. The aortic valve is normal in structure. Aortic valve regurgitation is not visualized. No aortic stenosis is present.  5. The inferior vena cava is normal in size with greater than 50% respiratory variability, suggesting right atrial pressure of 3 mmHg.   Left Heart Catheterization 03/19/22:  LV: 128/7, EDP 14 mmHg.  Ao 130/67, mean 100 mmHg.  No pressure gradient across the aortic valve. LVEF normal at 55% with no wall motion abnormality, no significant mitral regurgitation. LM: Nonexistent. LAD: Separate ostia.  Large-caliber vessel.  Has mild hazy 30% stenosis extending from the proximal to the mid segment rising at the origin of a very large D1.  Proximal segment of D1 also has 30% stenosis. LCx: Dominant and a large caliber vessel.  No significant disease evident. RCA: Nondominant.  Several small branches that supply the RV branch.  There was a focal 90% stenosis in the proximal RCA.  Partial relief with intracoronary nitroglycerin. Attempted intervention: In view of ongoing chest pain, EKG changes, I did wired the right coronary artery and RCA which was partially relieved spasm after nitroglycerin after  the passage of the wire was completely relieved with noted stool stenosis and a normal nondominant RCA.   Impression: Suspect acute pericarditis as etiology for her presentation with ongoing chest pain and EKG postprocedure again revealed persistent ST elevation in the inferior lateral leads with no reciprocal changes and concavity upwards suggestive of peritonitis.  Do not suspect ACS.  190 mL contrast utilized.  EKG:   EKG 04/03/2022: Normal sinus rhythm at the rate of 75 bpm, left axis  deviation, left anterior fascicular block.  Incomplete right bundle branch block.  No evidence of ischemia.     EKG 07/19/2021: Normal sinus rhythm at the rate of 70 bpm, normal axis, ST elevation in the inferior leads and lateral leads suggestive of pericarditis. No significant change from prior EKG on admission.   Compared to EKG done on 07/18/2021 at 2 PM, atrial fibrillation no longer present.   EKG 03/19/2022: Initial EKG from 03/18/2022 revealed normal sinus rhythm.  8:17 AM on 03/19/2022 (this morning) repeat EKG with chest discomfort reveals ST elevation in the inferior and lateral leads suggestive of acute STEMI.   Assessment     ICD-10-CM   1. Acute idiopathic pericarditis  I30.0     2. Coronary artery disease involving native coronary artery of native heart with angina pectoris with documented spasm ([place])  I25.111     3. Primary hypertension  I10     4. Pure hypercholesterolemia  E78.00        No orders of the defined types were placed in this encounter.   No orders of the defined types were placed in this encounter.   There are no discontinued medications.   Recommendations:   Michele Reyes is a 72 y.o. female with history of hypertension, hyperlipidemia, obesity who presents with ongoing chest pain and inferolateral STEMI on EKG,. Hence is brought to the cardiac catheterization lab to evaluate the coronary anatomy for definitive diagnosis of CAD.     Her coronary anatomy revealed coronary spasm in the nondominant RCA otherwise no significant disease, EKG changes out of proportion.  She was diagnosed with acute peritonitis and discharged home on colchicine and for coronary spasm on diltiazem.  She now presents for follow-up.  1. Acute idiopathic pericarditis Patient with acute idiopathic pericarditis, EKG reveals normal sinus rhythm with no acute changes.  Advised her to continue with colchicine for a total of 3 months, will reduce the dose from colchicine twice daily  to daily.  2. Coronary artery disease involving native coronary artery of native heart with angina pectoris with documented spasm [facility]) She has mild coronary atherosclerosis on top of documented coronary spasm especially involving the right coronary artery.  This was anginal equivalent chest discomfort as well.  She has used 1 sublingual nitroglycerin since last office visit.  I suspect with weight loss, aggressive control of her lipids, normalization of her blood pressure and addition of diltiazem will all help with decreasing the risk of coronary spasm and angina pectoris.  Hence continue present medications, also continue losartan for cardiovascular protection.  3. Primary hypertension Blood pressure is under excellent control with present medical regimen, continue the same.  4. Pure hypercholesterolemia She is presently on Lipitor, initially we had thought that she was only on Zetia but her PCP had changed her to Crestor.  She can continue either Lipitor or Crestor 10 mg will suffice, suspect her LDL will be very close to 70.  This is being done as she has mild coronary atherosclerosis as  well.  This will also help with coronary spasm as well.  Weight loss has been discussed with the patient, she appears to be motivated.  Overall stable from cardiac standpoint unless she has any new symptoms, I will see him back on a as needed basis.  Right radial arterial access site without any complication, mild ecchymosis noted in the forearm which will completely subside in the near future.    Adrian Prows, MD, [facility] 04/03/2022, 7:16 AM Office: [phone]

## 2022-06-05 ENCOUNTER — Other Ambulatory Visit: Payer: Self-pay | Admitting: Obstetrics and Gynecology

## 2022-06-05 DIAGNOSIS — E2839 Other primary ovarian failure: Secondary | ICD-10-CM

## 2022-06-13 ENCOUNTER — Other Ambulatory Visit: Payer: Self-pay

## 2022-06-13 MED ORDER — DILTIAZEM HCL ER COATED BEADS 120 MG PO CP24: 120.0000 mg | ORAL_CAPSULE | Freq: Every day | ORAL | 6 refills | Status: DC

## 2022-06-23 ENCOUNTER — Other Ambulatory Visit: Payer: Self-pay

## 2022-06-23 MED ORDER — ATORVASTATIN CALCIUM 10 MG PO TABS: 10.0000 mg | ORAL_TABLET | Freq: Every day | ORAL | 6 refills | Status: DC

## 2022-12-04 ENCOUNTER — Ambulatory Visit
Admission: RE | Admit: 2022-12-04 | Discharge: 2022-12-04 | Disposition: A | Discharge status: Home / Self Care | Payer: PPO | Source: Ambulatory Visit | Attending: Obstetrics and Gynecology | Admitting: Obstetrics and Gynecology

## 2022-12-04 DIAGNOSIS — E2839 Other primary ovarian failure: Secondary | ICD-10-CM

## 2022-12-11 ENCOUNTER — Other Ambulatory Visit: Payer: Self-pay | Admitting: Cardiology

## 2023-02-11 ENCOUNTER — Other Ambulatory Visit: Payer: Self-pay

## 2023-02-11 MED ORDER — ATORVASTATIN CALCIUM 10 MG PO TABS: 10.0000 mg | ORAL_TABLET | Freq: Every day | ORAL | 0 refills | Status: DC

## 2023-08-08 ENCOUNTER — Other Ambulatory Visit: Payer: Self-pay | Admitting: Cardiology
